# Patient Record
Sex: Female | Born: 1995 | Race: White | Hispanic: No | Marital: Single | Smoking: Never smoker
Health system: Southern US, Community
[De-identification: ages and names within clinical notes are randomized; demographics above are authoritative.]

## PROBLEM LIST (undated history)

## (undated) DIAGNOSIS — N83209 Unspecified ovarian cyst, unspecified side: Secondary | ICD-10-CM

---

## 2015-02-03 ENCOUNTER — Emergency Department (HOSPITAL_COMMUNITY)
Admission: EM | Admit: 2015-02-03 | Discharge: 2015-02-03 | Disposition: A | Discharge status: Home / Self Care | Payer: BLUE CROSS/BLUE SHIELD | Attending: Emergency Medicine | Admitting: Emergency Medicine

## 2015-02-03 ENCOUNTER — Encounter (HOSPITAL_COMMUNITY): Payer: Self-pay | Admitting: Emergency Medicine

## 2015-02-03 DIAGNOSIS — R05 Cough: Secondary | ICD-10-CM | POA: Diagnosis present

## 2015-02-03 DIAGNOSIS — J209 Acute bronchitis, unspecified: Secondary | ICD-10-CM | POA: Insufficient documentation

## 2015-02-03 DIAGNOSIS — Z8742 Personal history of other diseases of the female genital tract: Secondary | ICD-10-CM | POA: Diagnosis not present

## 2015-02-03 DIAGNOSIS — J40 Bronchitis, not specified as acute or chronic: Secondary | ICD-10-CM

## 2015-02-03 HISTORY — DX: Unspecified ovarian cyst, unspecified side: N83.209

## 2015-02-03 MED ORDER — AZITHROMYCIN 250 MG PO TABS: 250.0000 mg | ORAL_TABLET | Freq: Every day | ORAL | Status: DC

## 2015-02-03 MED ORDER — HYDROCODONE-HOMATROPINE 5-1.5 MG/5ML PO SYRP: 5.0000 mL | ORAL_SOLUTION | Freq: Four times a day (QID) | ORAL | Status: DC | PRN

## 2015-02-03 NOTE — ED Notes (Signed)
Pt stable, ambulatory, prescriptions explained, pain 4/10.

## 2015-02-03 NOTE — ED Provider Notes (Signed)
CSN: [id number]     Arrival date & time 02/03/15  1514 History   First MD Initiated Contact with Patient 02/03/15 1527     Chief Complaint  Patient presents with  . Generalized Body Aches  . Cough  . Nasal Congestion      HPI Patient presents with weeklong history of increasing congestion with productive yellow sputum.  Denies fever or chills.  Nausea or vomiting.  Cough is keeping her awake at night.  Patient has no other complaints. Past Medical History  Diagnosis Date  . Ruptured ovarian cyst    History reviewed. No pertinent past surgical history. No family history on file. History  Substance Use Topics  . Smoking status: Never Smoker   . Smokeless tobacco: Not on file  . Alcohol Use: No   OB History    No data available     Review of Systems  All other systems reviewed and are negative  Allergies  Cephalosporins and Toradol  Home Medications   Prior to Admission medications   Medication Sig Start Date End Date Taking? Authorizing Provider  azithromycin (ZITHROMAX) 250 MG tablet Take 1 tablet (250 mg total) by mouth daily. Take first 2 tablets together, then 1 every day until finished. 02/03/15   Nelva Nayobert Demesha Boorman, MD  HYDROcodone-homatropine [facility](HYCODAN) 5-1.5 MG/5ML syrup Take 5 mLs by mouth every 6 (six) hours as needed for cough. 02/03/15   Nelva Nayobert Ronin Rehfeldt, MD   BP 108/66 mmHg  Pulse 81  Temp(Src) 98.3 F (36.8 C) (Oral)  Resp 20  Ht 5\' 5"  (1.651 m)  Wt 130 lb (58.968 kg)  BMI 21.63 kg/m2  SpO2 98%  LMP 01/27/2015 (Exact Date) Physical Exam Physical Exam  Nursing note and vitals reviewed. Constitutional: She is oriented to person, place, and time. She appears well-developed and well-nourished. No distress.  HENT:  Head: Normocephalic and atraumatic.  Eyes: Pupils are equal, round, and reactive to light.  Neck: Normal range of motion.  Cardiovascular: Normal rate and intact distal pulses.   Pulmonary/Chest: No respiratory distress.  No wheezes rhonchi to  auscultation.   Abdominal: Normal appearance. She exhibits no distension.  Musculoskeletal: Normal range of motion.  Neurological: She is alert and oriented to person, place, and time. No cranial nerve deficit.  Skin: Skin is warm and dry. No rash noted.  Psychiatric: She has a normal mood and affect. Her behavior is normal.   ED Course  Procedures (including critical care time) Labs Review Labs Reviewed - No data to display     MDM   Final diagnoses:  Bronchitis        Nelva Nayobert Shametra Cumberland, MD 02/03/15 1539

## 2015-02-03 NOTE — ED Notes (Signed)
Pt c/o body aches with cough and cold symptoms ongoing since last week.

## 2015-02-03 NOTE — Discharge Instructions (Signed)
Upper Respiratory Infection, Adult An upper respiratory infection (URI) is also sometimes known as the common cold. The upper respiratory tract includes the nose, sinuses, throat, trachea, and bronchi. Bronchi are the airways leading to the lungs. Most people improve within 1 week, but symptoms can last up to 2 weeks. A residual cough may last even longer.  CAUSES Many different viruses can infect the tissues lining the upper respiratory tract. The tissues become irritated and inflamed and often become very moist. Mucus production is also common. A cold is contagious. You can easily spread the virus to others by oral contact. This includes kissing, sharing a glass, coughing, or sneezing. Touching your mouth or nose and then touching a surface, which is then touched by another person, can also spread the virus. SYMPTOMS  Symptoms typically develop 1 to 3 days after you come in contact with a cold virus. Symptoms vary from person to person. They may include:  Runny nose.  Sneezing.  Nasal congestion.  Sinus irritation.  Sore throat.  Loss of voice (laryngitis).  Cough.  Fatigue.  Muscle aches.  Loss of appetite.  Headache.  Low-grade fever. DIAGNOSIS  You might diagnose your own cold based on familiar symptoms, since most people get a cold 2 to 3 times a year. Your caregiver can confirm this based on your exam. Most importantly, your caregiver can check that your symptoms are not due to another disease such as strep throat, sinusitis, pneumonia, asthma, or epiglottitis. Blood tests, throat tests, and X-rays are not necessary to diagnose a common cold, but they may sometimes be helpful in excluding other more serious diseases. Your caregiver will decide if any further tests are required. RISKS AND COMPLICATIONS  You may be at risk for a more severe case of the common cold if you smoke cigarettes, have chronic heart disease (such as heart failure) or lung disease (such as asthma), or if  you have a weakened immune system. The very young and very old are also at risk for more serious infections. Bacterial sinusitis, middle ear infections, and bacterial pneumonia can complicate the common cold. The common cold can worsen asthma and chronic obstructive pulmonary disease (COPD). Sometimes, these complications can require emergency medical care and may be life-threatening. PREVENTION  The best way to protect against getting a cold is to practice good hygiene. Avoid oral or hand contact with people with cold symptoms. Wash your hands often if contact occurs. There is no clear evidence that vitamin C, vitamin E, echinacea, or exercise reduces the chance of developing a cold. However, it is always recommended to get plenty of rest and practice good nutrition. TREATMENT  Treatment is directed at relieving symptoms. There is no cure. Antibiotics are not effective, because the infection is caused by a virus, not by bacteria. Treatment may include:  Increased fluid intake. Sports drinks offer valuable electrolytes, sugars, and fluids.  Breathing heated mist or steam (vaporizer or shower).  Eating chicken soup or other clear broths, and maintaining good nutrition.  Getting plenty of rest.  Using gargles or lozenges for comfort.  Controlling fevers with ibuprofen or acetaminophen as directed by your caregiver.  Increasing usage of your inhaler if you have asthma. Zinc gel and zinc lozenges, taken in the first 24 hours of the common cold, can shorten the duration and lessen the severity of symptoms. Pain medicines may help with fever, muscle aches, and throat pain. A variety of non-prescription medicines are available to treat congestion and runny nose. Your caregiver   can make recommendations and may suggest nasal or lung inhalers for other symptoms.  HOME CARE INSTRUCTIONS   Only take over-the-counter or prescription medicines for pain, discomfort, or fever as directed by your  caregiver.  Use a warm mist humidifier or inhale steam from a shower to increase air moisture. This may keep secretions moist and make it easier to breathe.  Drink enough water and fluids to keep your urine clear or pale yellow.  Rest as needed.  Return to work when your temperature has returned to normal or as your caregiver advises. You may need to stay home longer to avoid infecting others. You can also use a face mask and careful hand washing to prevent spread of the virus. SEEK MEDICAL CARE IF:   After the first few days, you feel you are getting worse rather than better.  You need your caregiver's advice about medicines to control symptoms.  You develop chills, worsening shortness of breath, or brown or red sputum. These may be signs of pneumonia.  You develop yellow or brown nasal discharge or pain in the face, especially when you bend forward. These may be signs of sinusitis.  You develop a fever, swollen neck glands, pain with swallowing, or white areas in the back of your throat. These may be signs of strep throat. SEEK IMMEDIATE MEDICAL CARE IF:   You have a fever.  You develop severe or persistent headache, ear pain, sinus pain, or chest pain.  You develop wheezing, a prolonged cough, cough up blood, or have a change in your usual mucus (if you have chronic lung disease).  You develop sore muscles or a stiff neck. Document Released: 04/14/2001 Document Revised: 01/11/2012 Document Reviewed: 01/24/2014 Exit[employer] Patient Information 2015 [employer]. This information is not intended to replace advice given to you by your health care provider. Make sure you discuss any questions you have with your health care provider.  

## 2015-02-19 ENCOUNTER — Encounter (HOSPITAL_COMMUNITY): Payer: Self-pay | Admitting: *Deleted

## 2015-02-19 ENCOUNTER — Emergency Department (HOSPITAL_COMMUNITY)
Admission: EM | Admit: 2015-02-19 | Discharge: 2015-02-19 | Disposition: A | Discharge status: Home / Self Care | Payer: BLUE CROSS/BLUE SHIELD | Attending: Emergency Medicine | Admitting: Emergency Medicine

## 2015-02-19 DIAGNOSIS — H109 Unspecified conjunctivitis: Secondary | ICD-10-CM | POA: Insufficient documentation

## 2015-02-19 DIAGNOSIS — Z792 Long term (current) use of antibiotics: Secondary | ICD-10-CM | POA: Insufficient documentation

## 2015-02-19 DIAGNOSIS — H578 Other specified disorders of eye and adnexa: Secondary | ICD-10-CM | POA: Diagnosis present

## 2015-02-19 DIAGNOSIS — Z87448 Personal history of other diseases of urinary system: Secondary | ICD-10-CM | POA: Insufficient documentation

## 2015-02-19 MED ORDER — POLYMYXIN B-TRIMETHOPRIM 10000-0.1 UNIT/ML-% OP SOLN: 1.0000 [drp] | OPHTHALMIC | Status: DC

## 2015-02-19 MED ORDER — TETRACAINE HCL 0.5 % OP SOLN
1.0000 [drp] | Freq: Once | OPHTHALMIC | Status: AC
  Administered 2015-02-19: 1 [drp] via OPHTHALMIC
  Filled 2015-02-19: qty 2

## 2015-02-19 MED ORDER — FLUORESCEIN SODIUM 1 MG OP STRP
1.0000 | ORAL_STRIP | Freq: Once | OPHTHALMIC | Status: AC
  Administered 2015-02-19: 1 via OPHTHALMIC
  Filled 2015-02-19: qty 1

## 2015-02-19 NOTE — ED Provider Notes (Signed)
CSN: [id number]     Arrival date & time 02/19/15  1328 History  This chart was scribed for non-physician practitioner Roxy Horseman, PA-C working with Richardean Canal, MD by Littie Deeds, ED Scribe. This patient was seen in room TR04C/TR04C and the patient's care was started at 2:39 PM.     Chief Complaint  Patient presents with  . Conjunctivitis   The history is provided by the patient. No language interpreter was used.    HPI Comments: Nichole Mendez is a 19 y.o. female who presents to the Emergency Department complaining of gradual onset, constant eye redness that started when she woke up this morning. She also reports having associated eye itching and watery eye discharge. Patient denies any known foreign objects in her eye. She does report having sick contacts throughout campus. She denies smoking.   Past Medical History  Diagnosis Date  . Ruptured ovarian cyst    History reviewed. No pertinent past surgical history. History reviewed. No pertinent family history. History  Substance Use Topics  . Smoking status: Never Smoker   . Smokeless tobacco: Not on file  . Alcohol Use: No   OB History    No data available     Review of Systems  Constitutional: Negative for fever.  Eyes: Positive for discharge, redness and itching.      Allergies  Cephalosporins and Toradol  Home Medications   Prior to Admission medications   Medication Sig Start Date End Date Taking? Authorizing Provider  azithromycin (ZITHROMAX) 250 MG tablet Take 1 tablet (250 mg total) by mouth daily. Take first 2 tablets together, then 1 every day until finished. 02/03/15   Nelva Nay, MD  HYDROcodone-homatropine [facility]) 5-1.5 MG/5ML syrup Take 5 mLs by mouth every 6 (six) hours as needed for cough. 02/03/15   Nelva Nay, MD   BP 97/67 mmHg  Pulse 95  Temp(Src) 98.9 F (37.2 C) (Oral)  Resp 18  Ht  (1.651 m)  Wt 130 lb (58.968 kg)  BMI 21.63 kg/m2  SpO2 100%  LMP 01/27/2015 (Exact  Date) Physical Exam  Constitutional: She is oriented to person, place, and time. She appears well-developed and well-nourished. No distress.  HENT:  Head: Normocephalic and atraumatic.  Mouth/Throat: Oropharynx is clear and moist. No oropharyngeal exudate.  Eyes: Pupils are equal, round, and reactive to light.  Visual acuity: right 20/100, left 20/100, bilateral 20/100  Mild conjunctival erythema bilaterally, more on the right than on the left, no corneal abrasion, no foreign body, normal extraocular eye movement  Neck: Neck supple.  Cardiovascular: Normal rate.   Pulmonary/Chest: Effort normal.  Musculoskeletal: She exhibits no edema.  Neurological: She is alert and oriented to person, place, and time. No cranial nerve deficit.  Skin: Skin is warm and dry. No rash noted.  Psychiatric: She has a normal mood and affect. Her behavior is normal.  Nursing note and vitals reviewed.   ED Course  Procedures  DIAGNOSTIC STUDIES: Oxygen Saturation is 100% on room air, normal by my interpretation.    COORDINATION OF CARE: 2:42 PM-Discussed treatment plan which includes prescription eye drops with pt at bedside and pt agreed to plan. Instructed to wash bed sheets/towels and avoid touching eyes.   Labs Review Labs Reviewed - No data to display  Imaging Review No results found.   EKG Interpretation None      MDM   Final diagnoses:  Bilateral conjunctivitis    Patient with bilateral conjunctivitis, she does have some thick milky  discharge and moderate conjunctival erythema, no corneal laceration or abrasion. Will treat with Polytrim. Recommend ophthalmology follow-up. Patient understands agrees to plan. She is stable and ready for discharge.  I personally performed the services described in this documentation, which was scribed in my presence. The recorded information has been reviewed and is accurate.    Roxy HorsemanRobert Benetta Maclaren, PA-C 02/19/15 1453  Richardean Canalavid H Yao, MD 02/19/15 [phone]

## 2015-02-19 NOTE — Discharge Instructions (Signed)

## 2015-02-19 NOTE — ED Notes (Signed)
Pt reports bilateral eye reddness, drainage, itching. Pt also reports cold symptoms starting today.

## 2015-09-02 ENCOUNTER — Encounter (HOSPITAL_COMMUNITY): Payer: Self-pay | Admitting: Emergency Medicine

## 2015-09-02 ENCOUNTER — Emergency Department (HOSPITAL_COMMUNITY)
Admission: EM | Admit: 2015-09-02 | Discharge: 2015-09-02 | Disposition: A | Discharge status: Home / Self Care | Payer: BLUE CROSS/BLUE SHIELD | Attending: Emergency Medicine | Admitting: Emergency Medicine

## 2015-09-02 ENCOUNTER — Emergency Department (INDEPENDENT_AMBULATORY_CARE_PROVIDER_SITE_OTHER)
Admission: EM | Admit: 2015-09-02 | Discharge: 2015-09-02 | Disposition: A | Discharge status: Home / Self Care | Payer: BLUE CROSS/BLUE SHIELD | Source: Home / Self Care | Attending: Emergency Medicine | Admitting: Emergency Medicine

## 2015-09-02 DIAGNOSIS — Z8742 Personal history of other diseases of the female genital tract: Secondary | ICD-10-CM | POA: Diagnosis not present

## 2015-09-02 DIAGNOSIS — G43109 Migraine with aura, not intractable, without status migrainosus: Secondary | ICD-10-CM

## 2015-09-02 DIAGNOSIS — H9203 Otalgia, bilateral: Secondary | ICD-10-CM | POA: Diagnosis not present

## 2015-09-02 DIAGNOSIS — R202 Paresthesia of skin: Secondary | ICD-10-CM | POA: Diagnosis not present

## 2015-09-02 DIAGNOSIS — H5713 Ocular pain, bilateral: Secondary | ICD-10-CM | POA: Insufficient documentation

## 2015-09-02 DIAGNOSIS — B9789 Other viral agents as the cause of diseases classified elsewhere: Secondary | ICD-10-CM

## 2015-09-02 DIAGNOSIS — J069 Acute upper respiratory infection, unspecified: Secondary | ICD-10-CM | POA: Insufficient documentation

## 2015-09-02 DIAGNOSIS — R8299 Other abnormal findings in urine: Secondary | ICD-10-CM | POA: Insufficient documentation

## 2015-09-02 DIAGNOSIS — J029 Acute pharyngitis, unspecified: Secondary | ICD-10-CM | POA: Diagnosis present

## 2015-09-02 DIAGNOSIS — Z3202 Encounter for pregnancy test, result negative: Secondary | ICD-10-CM | POA: Insufficient documentation

## 2015-09-02 DIAGNOSIS — R8290 Unspecified abnormal findings in urine: Secondary | ICD-10-CM

## 2015-09-02 LAB — URINE MICROSCOPIC-ADD ON

## 2015-09-02 LAB — URINALYSIS, ROUTINE W REFLEX MICROSCOPIC
Bilirubin Urine: NEGATIVE
Glucose, UA: NEGATIVE mg/dL
KETONES UR: NEGATIVE mg/dL
Nitrite: NEGATIVE
PROTEIN: 100 mg/dL — AB
Specific Gravity, Urine: 1.02 (ref 1.005–1.030)
Urobilinogen, UA: 1 mg/dL (ref 0.0–1.0)
pH: 7 (ref 5.0–8.0)

## 2015-09-02 LAB — PREGNANCY, URINE: Preg Test, Ur: NEGATIVE

## 2015-09-02 MED ORDER — SUMATRIPTAN SUCCINATE 100 MG PO TABS: 100.0000 mg | ORAL_TABLET | ORAL | Status: DC | PRN

## 2015-09-02 MED ORDER — FLUTICASONE PROPIONATE 50 MCG/ACT NA SUSP: 2.0000 | Freq: Every day | NASAL | Status: DC

## 2015-09-02 MED ORDER — ONDANSETRON 4 MG PO TBDP: 4.0000 mg | ORAL_TABLET | Freq: Three times a day (TID) | ORAL | Status: DC | PRN

## 2015-09-02 MED ORDER — BUTALBITAL-APAP-CAFFEINE 50-325-40 MG PO TABS: 1.0000 | ORAL_TABLET | Freq: Four times a day (QID) | ORAL | Status: DC | PRN

## 2015-09-02 MED ORDER — NAPROXEN 500 MG PO TABS: 500.0000 mg | ORAL_TABLET | Freq: Two times a day (BID) | ORAL | Status: DC

## 2015-09-02 MED ORDER — BENZONATATE 100 MG PO CAPS: 100.0000 mg | ORAL_CAPSULE | Freq: Three times a day (TID) | ORAL | Status: DC

## 2015-09-02 NOTE — ED Provider Notes (Signed)
HPI  SUBJECTIVE:  Nichole Mendez is a 19 y.o. female who presents with right-sided tingling and numbness on her right arm and right face starting at 1930 tonight. Stated lasted approximately 20 minutes and then resolved. She now reports a central, dull, throbbing headache located in the middle for for head with some photophobia. Symptoms are worse with light, no alleviating factors. She has not tried anything for this. She states that she has had identical symptoms before on the side when she has had migraines. States that the numbness has now resolved. She denies arm or leg weakness, dysarthria, aphasia, nausea, vomiting, chest pain, shortness of breath, facial droop, sinus pain/pressure, fevers, presyncope, syncope. Reports some mild nasal congestion. Past medical history of migraines with aura, ruptured ovarian cysts. Negative for stroke, atrial fibrillation or other arrhythmia, sickle cell disease. Family history negative for strokes. Patient states that Naprosyn and Zofran works well for her migraines.    Patient was seen in the ER this morning for sore throat and urinary complaints. She was thought to have a viral URI, and possible UTI, however, patient decided to wait for urine culture results prior to initiating treatment.   Past Medical History  Diagnosis Date  . Ruptured ovarian cyst     History reviewed. No pertinent past surgical history.  No family history on file.  Social History  Substance Use Topics  . Smoking status: Never Smoker   . Smokeless tobacco: None  . Alcohol Use: No    No current facility-administered medications for this encounter.  Current outpatient prescriptions:  .  fluticasone (FLONASE) 50 MCG/ACT nasal spray, Place 2 sprays into both nostrils daily., Disp: 9.9 g, Rfl: 2 .  naproxen (NAPROSYN) 500 MG tablet, Take 1 tablet (500 mg total) by mouth 2 (two) times daily., Disp: 20 tablet, Rfl: 0 .  ondansetron (ZOFRAN ODT) 4 MG disintegrating  tablet, Take 1 tablet (4 mg total) by mouth every 8 (eight) hours as needed for nausea or vomiting., Disp: 20 tablet, Rfl: 0 .  SUMAtriptan (IMITREX) 100 MG tablet, Take 1 tablet (100 mg total) by mouth every 2 (two) hours as needed for migraine. One tab at onset and may repeat x1 in 1 hour. Max 200 mg/24 hours, Disp: 10 tablet, Rfl: 0  Allergies  Allergen Reactions  . Cephalosporins   . Toradol [Ketorolac Tromethamine]      ROS  As noted in HPI.   Physical Exam  LMP 08/12/2015  Constitutional: Well developed, well nourished, no acute distress. + photophobia Eyes: PERRL, EOMI, conjunctiva normal bilaterally . Patient unable to tolerate funduscopic. HENT: Normocephalic, atraumatic,mucus membranes moist. bilateral cerumen impaction. Minimal nasal congestion. Mild frontal sinus tenderness. No maxillary sinus tenderness. Dentition normal. Neck, no meningismus. Respiratory: Clear to auscultation bilaterally, no rales, no wheezing, no rhonchi Cardiovascular: Normal rate and rhythm, no murmurs, no gallops, no rubs GI: Soft, nondistended, normal bowel sounds, nontender, no rebound, no guarding Back: no CVAT skin: No rash, skin intact Musculoskeletal: No edema, no tenderness, no deformities Neurologic: Alert & oriented x 3, CN II-XII intact, sensation to temperature, light touch, pinprick equal on her upper extremities and face, no motor deficits, tandem gait steady, Romberg negative, finger nose, heel shin within normal limits. Psychiatric: Speech and behavior appropriate   ED Course   Medications - No data to display  No orders of the defined types were placed in this encounter.   Results for orders placed or performed during the hospital encounter of 09/02/15 (from the past 24  hour(s))  Urinalysis, Routine w reflex microscopic ([facility])     Status: Abnormal   Collection Time: 09/02/15 10:39 AM  Result Value Ref Range   Color, Urine YELLOW YELLOW   APPearance CLOUDY (A) CLEAR    Specific Gravity, Urine 1.020 1.005 - 1.030   pH 7.0 5.0 - 8.0   Glucose, UA NEGATIVE NEGATIVE mg/dL   Hgb urine dipstick MODERATE (A) NEGATIVE   Bilirubin Urine NEGATIVE NEGATIVE   Ketones, ur NEGATIVE NEGATIVE mg/dL   Protein, ur 409100 (A) NEGATIVE mg/dL   Urobilinogen, UA 1.0 0.0 - 1.0 mg/dL   Nitrite NEGATIVE NEGATIVE   Leukocytes, UA SMALL (A) NEGATIVE  Pregnancy, urine     Status: None   Collection Time: 09/02/15 10:39 AM  Result Value Ref Range   Preg Test, Ur NEGATIVE NEGATIVE  Urine microscopic-add on     Status: Abnormal   Collection Time: 09/02/15 10:39 AM  Result Value Ref Range   Squamous Epithelial / LPF FEW (A) RARE   WBC, UA 7-10 <3 WBC/hpf   RBC / HPF 11-20 <3 RBC/hpf   Bacteria, UA RARE RARE   Urine-Other MUCOUS PRESENT    No results found.  ED Clinical Impression  Paresthesias  Migraine with aura and without status migrainosus, not intractable  ED Assessment/Plan   Urine pregnant negative.  Patient is completely neurologically intact, states her symptoms have resolved and now has a headache. Given that she has had identical symptoms before on this side with migraines in the past, we'll treat as such. Patient states Naprosyn and Zofran work well for her. She has also been on Imitrex in the past. She does not have any at home.  No evidence of stroke here, but gave pt very strict return precautions. .  Discussed  MDM, plan and followup with patient. Discussed sn/sx that should prompt return to the  ED. Patient  agrees with plan.   *This clinic note was created using Dragon dictation software. Therefore, there may be occasional mistakes despite careful proofreading.  ?   Domenick GongAshley Zailyn Rowser, MD 09/02/15 2033

## 2015-09-02 NOTE — ED Provider Notes (Signed)
CSN: [id number]     Arrival date & time 09/02/15  7829 History   By signing my name below, I, Freida Busman, attest that this documentation has been prepared under the direction and in the presence of non-physician practitioner, Dierdre Forth, PA-C. Electronically Signed: Freida Busman, Scribe. 09/02/2015. 10:57 AM.    Chief Complaint  Patient presents with  . Sore Throat  . Otalgia  . Cough  . Urinary Tract Infection   The history is provided by the patient. No language interpreter was used.   HPI Comments:  Nichole Mendez is a 19 y.o. female who presents to the Emergency Department complaining of a sore throat since yesterday. She reports associated bilateral ear pain, eye pain with movement of her eyes, nasal congestion and pressure, cough, and mild and throbbing HA for one week.  She has taken robotussin without relief. Pt denies sick contacts, fever. No aggravating or alleviating factors noted.  Patient also complains of malodorous urine this morning. She denies dysuria, hematuria, fever, chills, abdominal pain, nausea, vomiting.  Patient reports she is sexually active in taking oral contraceptives.   Past Medical History  Diagnosis Date  . Ruptured ovarian cyst    History reviewed. No pertinent past surgical history. No family history on file. Social History  Substance Use Topics  . Smoking status: Never Smoker   . Smokeless tobacco: None  . Alcohol Use: No   OB History    No data available     Review of Systems  Constitutional: Negative for fever, chills, appetite change and fatigue.  HENT: Positive for congestion, ear pain, postnasal drip, rhinorrhea, sinus pressure and sore throat. Negative for ear discharge and mouth sores.   Eyes: Negative for pain and visual disturbance.  Respiratory: Positive for cough. Negative for chest tightness, shortness of breath, wheezing and stridor.   Cardiovascular: Negative for chest pain, palpitations and leg swelling.   Gastrointestinal: Negative for nausea, vomiting, abdominal pain and diarrhea.  Genitourinary: Negative for dysuria, urgency, frequency, hematuria, vaginal bleeding and vaginal discharge.       Malodorous urine  Musculoskeletal: Negative for myalgias, back pain, arthralgias and neck stiffness.  Skin: Negative for rash.  Neurological: Negative for syncope, light-headedness, numbness and headaches.  Hematological: Negative for adenopathy.  Psychiatric/Behavioral: The patient is not nervous/anxious.   All other systems reviewed and are negative.  Allergies  Cephalosporins and Toradol  Home Medications   Prior to Admission medications   Medication Sig Start Date End Date Taking? Authorizing Provider  azithromycin (ZITHROMAX) 250 MG tablet Take 1 tablet (250 mg total) by mouth daily. Take first 2 tablets together, then 1 every day until finished. 02/03/15   Nelva Nay, MD  benzonatate (TESSALON) 100 MG capsule Take 1 capsule (100 mg total) by mouth every 8 (eight) hours. 09/02/15   Fordyce Lepak, PA-C  fluticasone (FLONASE) 50 MCG/ACT nasal spray Place 2 sprays into both nostrils daily. 09/02/15   Vernal Rutan, PA-C  HYDROcodone-homatropine (HYCODAN) 5-1.5 MG/5ML syrup Take 5 mLs by mouth every 6 (six) hours as needed for cough. 02/03/15   Nelva Nay, MD  trimethoprim-polymyxin b (POLYTRIM) ophthalmic solution Place 1 drop into both eyes every 4 (four) hours. 02/19/15   Roxy Horseman, PA-C   BP 104/59 mmHg  Pulse 88  Temp(Src) 98.4 F (36.9 C) (Oral)  Resp 16  Ht  (1.651 m)  Wt 135 lb (61.236 kg)  BMI 22.47 kg/m2  SpO2 100%  LMP 08/12/2015 Physical Exam  Constitutional: She is oriented to  person, place, and time. She appears well-developed and well-nourished. No distress.  Awake, alert, nontoxic appearance  HENT:  Head: Normocephalic and atraumatic.  Right Ear: Tympanic membrane, external ear and ear canal normal.  Left Ear: Tympanic membrane, external ear and  ear canal normal.  Nose: Mucosal edema and rhinorrhea present. No epistaxis. Right sinus exhibits no maxillary sinus tenderness and no frontal sinus tenderness. Left sinus exhibits no maxillary sinus tenderness and no frontal sinus tenderness.  Mouth/Throat: Uvula is midline, oropharynx is clear and moist and mucous membranes are normal. Mucous membranes are not pale and not cyanotic. No oropharyngeal exudate, posterior oropharyngeal edema, posterior oropharyngeal erythema or tonsillar abscesses.  Eyes: Conjunctivae are normal. Pupils are equal, round, and reactive to light. No scleral icterus.  Neck: Normal range of motion and full passive range of motion without pain. Neck supple.  Cardiovascular: Normal rate, regular rhythm, normal heart sounds and intact distal pulses.   Pulmonary/Chest: Effort normal and breath sounds normal. No stridor. No respiratory distress. She has no wheezes.  Clear and equal breath sounds without focal wheezes, rhonchi, rales  Abdominal: Soft. Bowel sounds are normal. She exhibits no mass. There is no tenderness. There is no rebound and no guarding.  Abdomen is soft and nontender  Musculoskeletal: Normal range of motion. She exhibits no edema.  Lymphadenopathy:    She has no cervical adenopathy.  Neurological: She is alert and oriented to person, place, and time.  Speech is clear and goal oriented Moves extremities without ataxia  Skin: Skin is warm and dry. No rash noted. She is not diaphoretic.  Psychiatric: She has a normal mood and affect.  Nursing note and vitals reviewed.   ED Course  Procedures   DIAGNOSTIC STUDIES:  Oxygen Saturation is 100% on RA, normal by my interpretation.    COORDINATION OF CARE:  10:34 AM Will order strep and UA. Discussed treatment plan with pt at bedside and pt agreed to plan.  11:45 AM Pt updated with results.    Labs Review Labs Reviewed  URINALYSIS, ROUTINE W REFLEX MICROSCOPIC ([facility]) - Abnormal; Notable for  the following:    APPearance CLOUDY (*)    Hgb urine dipstick MODERATE (*)    Protein, ur 100 (*)    Leukocytes, UA SMALL (*)    All other components within normal limits  URINE MICROSCOPIC-ADD ON - Abnormal; Notable for the following:    Squamous Epithelial / LPF FEW (*)    All other components within normal limits  URINE CULTURE  PREGNANCY, URINE    Imaging Review No results found. I have personally reviewed and evaluated these lab results as part of my medical decision-making.   EKG Interpretation None      MDM   Final diagnoses:  Viral URI with cough  Malodorous urine   Nichole Mendez presents with multiple complaints.  Pt afebrile with clear and equal breath sounds. No indication for chest x-ray at this time. Patients symptoms are consistent with URI, likely viral etiology. Discussed that antibiotics are not indicated for viral infections. Pt will be discharged with symptomatic treatment.    Patient also with malodorous urine but no other symptoms of UTI. Will check UA and pregnancy test.  12:05 PM UA with small leukocytes, negative nitrites and only 7-10 white blood cells. Patient admits to a weekend of heavy alcohol usage and no water intake. Question potential dehydration. Patient remains without dysuria or visible hematuria. Moderate amount of hemoglobin noted.  Discussed pros and  cons of treating with antibiotic empirically versus waiting for culture results. Patient wishes to wait for culture results. Recommend increased water intake for the next several days and to return to emergency department if new symptoms including fever, lower bowel pain, dysuria or frank hematuria developed.  Verbalizes understanding and is agreeable with plan. Pt is hemodynamically stable & in NAD prior to dc.  BP 104/59 mmHg  Pulse 88  Temp(Src) 98.4 F (36.9 C) (Oral)  Resp 16  Ht  (1.651 m)  Wt 135 lb (61.236 kg)  BMI 22.47 kg/m2  SpO2 100%  LMP 08/12/2015  I  personally performed the services described in this documentation, which was scribed in my presence. The recorded information has been reviewed and is accurate.     Dahlia Client Johne Buckle, PA-C 09/02/15 1205  Eber Hong, MD 09/04/15 [phone]

## 2015-09-02 NOTE — Discharge Instructions (Signed)
Go to the ER if the numbness and tingling returns, if you have trouble talking, trouble walking, arm or leg weakness, problems getting your words out, problems putting sentences together, facial droop, visual changes, if your headache is not treated with these medications, or for other concerns

## 2015-09-02 NOTE — ED Notes (Signed)
Pt here with c/o progressive numbness that started in right hand today, now radiating up arm Denies chest pain or sob States she experienced same sx's with migraine

## 2015-09-02 NOTE — ED Notes (Signed)
Patient states sore throat, L ear pain, and dry cough x 2 days.   Patient denies fever, N/V.

## 2015-09-02 NOTE — Discharge Instructions (Signed)
1. Medications: flonase, mucinex, tessalon, usual home medications 2. Treatment: rest, drink plenty of fluids, take tylenol or ibuprofen for fever control 3. Follow Up: Please followup with your primary doctor in 3 days for discussion of your diagnoses and further evaluation after today's visit; if you do not have a primary care doctor use the resource guide provided to find one; Return to the ER for high fevers, difficulty breathing or other concerning symptoms  You have a urine culture pending. If this grows bacteria we will contact you with an antibiotic prescription. Please return to the emergency department if you develop new symptoms including abdominal pain, low-grade fever, burning with urination or other concerns.    Upper Respiratory Infection, Adult Most upper respiratory infections (URIs) are a viral infection of the air passages leading to the lungs. A URI affects the nose, throat, and upper air passages. The most common type of URI is nasopharyngitis and is typically referred to as "the common cold." URIs run their course and usually go away on their own. Most of the time, a URI does not require medical attention, but sometimes a bacterial infection in the upper airways can follow a viral infection. This is called a secondary infection. Sinus and middle ear infections are common types of secondary upper respiratory infections. Bacterial pneumonia can also complicate a URI. A URI can worsen asthma and chronic obstructive pulmonary disease (COPD). Sometimes, these complications can require emergency medical care and may be life threatening.  CAUSES Almost all URIs are caused by viruses. A virus is a type of germ and can spread from one person to another.  RISKS FACTORS You may be at risk for a URI if:   You smoke.   You have chronic heart or lung disease.  You have a weakened defense (immune) system.   You are very young or very old.   You have nasal allergies or  asthma.  You work in crowded or poorly ventilated areas.  You work in health care facilities or schools. SIGNS AND SYMPTOMS  Symptoms typically develop 2-3 days after you come in contact with a cold virus. Most viral URIs last 7-10 days. However, viral URIs from the influenza virus (flu virus) can last 14-18 days and are typically more severe. Symptoms may include:   Runny or stuffy (congested) nose.   Sneezing.   Cough.   Sore throat.   Headache.   Fatigue.   Fever.   Loss of appetite.   Pain in your forehead, behind your eyes, and over your cheekbones (sinus pain).  Muscle aches.  DIAGNOSIS  Your health care provider may diagnose a URI by:  Physical exam.  Tests to check that your symptoms are not due to another condition such as:  Strep throat.  Sinusitis.  Pneumonia.  Asthma. TREATMENT  A URI goes away on its own with time. It cannot be cured with medicines, but medicines may be prescribed or recommended to relieve symptoms. Medicines may help:  Reduce your fever.  Reduce your cough.  Relieve nasal congestion. HOME CARE INSTRUCTIONS   Take medicines only as directed by your health care provider.   Gargle warm saltwater or take cough drops to comfort your throat as directed by your health care provider.  Use a warm mist humidifier or inhale steam from a shower to increase air moisture. This may make it easier to breathe.  Drink enough fluid to keep your urine clear or pale yellow.   Eat soups and other clear broths and  maintain good nutrition.   Rest as needed.   Return to work when your temperature has returned to normal or as your health care provider advises. You may need to stay home longer to avoid infecting others. You can also use a face mask and careful hand washing to prevent spread of the virus.  Increase the usage of your inhaler if you have asthma.   Do not use any tobacco products, including cigarettes, chewing tobacco,  or electronic cigarettes. If you need help quitting, ask your health care provider. PREVENTION  The best way to protect yourself from getting a cold is to practice good hygiene.   Avoid oral or hand contact with people with cold symptoms.   Wash your hands often if contact occurs.  There is no clear evidence that vitamin C, vitamin E, echinacea, or exercise reduces the chance of developing a cold. However, it is always recommended to get plenty of rest, exercise, and practice good nutrition.  SEEK MEDICAL CARE IF:   You are getting worse rather than better.   Your symptoms are not controlled by medicine.   You have chills.  You have worsening shortness of breath.  You have brown or red mucus.  You have yellow or brown nasal discharge.  You have pain in your face, especially when you bend forward.  You have a fever.  You have swollen neck glands.  You have pain while swallowing.  You have white areas in the back of your throat. SEEK IMMEDIATE MEDICAL CARE IF:   You have severe or persistent:  Headache.  Ear pain.  Sinus pain.  Chest pain.  You have chronic lung disease and any of the following:  Wheezing.  Prolonged cough.  Coughing up blood.  A change in your usual mucus.  You have a stiff neck.  You have changes in your:  Vision.  Hearing.  Thinking.  Mood. MAKE SURE YOU:   Understand these instructions.  Will watch your condition.  Will get help right away if you are not doing well or get worse.   This information is not intended to replace advice given to you by your health care provider. Make sure you discuss any questions you have with your health care provider.   Document Released: 04/14/2001 Document Revised: 03/05/2015 Document Reviewed: 01/24/2014 Elsevier Interactive Patient Education 2016 [employer].    Emergency Department Resource Guide 1) Find a Doctor and Pay Out of Pocket Although you won't have to find out who is  covered by your insurance plan, it is a good idea to ask around and get recommendations. You will then need to call the office and see if the doctor you have chosen will accept you as a new patient and what types of options they offer for patients who are self-pay. Some doctors offer discounts or will set up payment plans for their patients who do not have insurance, but you will need to ask so you aren't surprised when you get to your appointment.  2) Contact Your Local Health Department Not all health departments have doctors that can see patients for sick visits, but many do, so it is worth a call to see if yours does. If you don't know where your local health department is, you can check in your phone book. The CDC also has a tool to help you locate your state's health department, and many state websites also have listings of all of their local health departments.  3) Find a Walk-in Clinic If  your illness is not likely to be very severe or complicated, you may want to try a walk in clinic. These are popping up all over the country in pharmacies, drugstores, and shopping centers. They're usually staffed by nurse practitioners or physician assistants that have been trained to treat common illnesses and complaints. They're usually fairly quick and inexpensive. However, if you have serious medical issues or chronic medical problems, these are probably not your best option.  No Primary Care Doctor: - Call Health Connect at  [phone] - they can help you locate a primary care doctor that  accepts your insurance, provides certain services, etc. - Physician Referral Service- [phone]  Chronic Pain Problems: Organization         Address  Phone   Notes  [facility]  [phone] Patients need to be referred by their primary care doctor.   Medication Assistance: Organization         Address  Phone   Notes  [facility] [place] [facility] [address] [place], [place] [address] [phone] --Must be a resident of [facility] -- Must have NO insurance coverage whatsoever (no Medicaid/ Medicare, etc.) -- The pt. MUST have a primary care doctor that directs their care regularly and follows them in the community   [facility]  [phone]   [employer]  [phone]    Agencies that provide inexpensive medical care: Organization         Address  Phone   Notes  [place]  [phone]   [facility]    [phone]   [facility] [place], [place] [address] [phone]   [place] [address], [place] [phone]   Planned Parenthood    [phone]   [facility]    [phone]   [place] and [address], [place] Phone:  [phone], Fax:  [phone] Hours of Operation:  9 am - 6 pm, M-F.  Also accepts Medicaid/Medicare and self-pay.  [facility] for [address] [place], [address], [place] Phone: [phone], Fax: [phone]. Hours of Operation:  8:30 am - 5:30 pm, M-F.  Also accepts Medicaid and self-pay.  [facility] [place] [address] [place] Phone: [phone]   [place], [place], [place] [phone], Ext. [phone] Mondays & Thursdays: 7-9 AM.  First 15 patients are seen on a first come, first serve basis.    [place] Providers:  Organization         Address  Phone   Notes  [facility] [address], [place] [phone] Also accepts self-pay patients.  [facility] [address], [place]  [phone]   [place], [address], [place] [phone]   [facility] [address], [place] [phone]   Lucianne Lei [address], [place]   [phone] Only accepts [place] patients after they have their name applied to their card.   Self-Pay (no insurance) in [facility]:  Organization         Address  Phone   Notes  Sickle Cell Patients, [facility] [place], [place] [phone]  [facility] Urgent Care [address] [place], [place] [phone]   [facility] [place]  [address], [place] [phone]   [facility]/Dr. Osei-Bonsu  [address] [place] or [address], [place] [phone] Phone number for both [place] and [place] locations is the same.  Urgent Medical and [facility] [address], [place] [phone]   [facility] [address] [place] or [address] [phone]   [facility] [address] [place] [phone], phone; [phone], fax Sees patients 1st and 3rd Saturday of every month.  Must not qualify for public or private insurance (i.e. Medicaid, Medicare, [place] Health Choice, Veterans' Benefits)  Household income should be no more than 200% of the poverty level The clinic cannot treat you if you are pregnant or think you are pregnant  Sexually transmitted diseases are not treated at the clinic.    Dental Care: Organization         Address  Phone  Notes  [facility] Department of [facility] [address] [place], [place] [phone] Accepts children up to age 68 who are enrolled in [place] or [place] Health Choice; pregnant women with a Medicaid card; and children who have applied for Medicaid or [place] Health Choice, but were declined, whose parents can pay a reduced fee at time of service.  [facility]  [address], [place] [phone] Accepts children up to age 52 who are enrolled  in [place] or [place] Health Choice; pregnant women with a Medicaid card; and children who have applied for Medicaid or  Health Choice, but were declined, whose parents can pay a reduced fee at time of service.  [facility]  [address] [place], [place] [phone] Patients are seen by appointment only. Walk-ins are not accepted. [facility] will see patients 79 years of age and older. Monday - Tuesday (8am-5pm) Most Wednesdays (8:30-5pm) $30 per visit, cash only  [facility] Adult Dental Access PROGRAM  [address], [facility] [phone] Patients are seen by appointment only. Walk-ins are not accepted. [facility] will see patients 47 years of age and older. One Wednesday Evening (Monthly: Volunteer Based).  $30 per visit, cash only  [employer]  [phone] for adults; Children under age 68, call Graduate Pediatric Dentistry at [phone]. Children aged 73-14, please call [phone] to request a pediatric application.  Dental services are provided in all areas of dental care including fillings, crowns and bridges, complete and partial dentures, implants, gum treatment, root canals, and extractions. Preventive care is also provided. Treatment is provided to both adults and children. Patients are selected via a lottery and there is often a waiting list.   [facility] [address], [place]  [phone] [email or web address]   [facility] [address] [place], [place] [phone], Ext. [phone] Second and Fourth Thursday of each month, opens at 6:30 AM; Clinic ends at 9 AM.  Patients are seen on a first-come first-served basis, and a limited number are seen during each clinic.   [facility]  [address] [place], [place] [phone]   Eligibility Requirements You must have lived in [place], [place], or [place] counties for at least the last  three months.   You cannot be  eligible for state or federal sponsored [place], including [employer], [place], or [employer].   You generally cannot be eligible for healthcare insurance through your employer.    How to apply: Eligibility screenings are held every Tuesday and Wednesday afternoon from 1:00 pm until 4:00 pm. You do not need an appointment for the interview!  [facility] [address] [place], [place] [phone]   [facility] Health Department  [phone]   [facility] Health Department  [phone]   [facility] Health Department  [phone]    Behavioral Health Resources in the Community: Intensive Outpatient Programs Organization         Address  Phone  Notes  [facility] Services [address], [place], [place] [phone]   [facility] Outpatient [address], [place], [place] [phone]   [facility] [address], [place], [place]  [phone]   [facility] [address]  [place], [place] [phone] or [phone]   Substance Abuse Resources Organization         Address  Phone  Notes  [facility] Drug Services  [phone]   [facility]  [phone]   [facility] [place]  [phone]   Floydene Flock  [phone]   Residential & Outpatient Substance Abuse Program  [phone]   Psychological Services Organization         Address  Phone  Notes  [facility]  [phone]   [facility] Services  [phone]   [facility] [address], [place] [phone] or [phone]    Mobile Crisis Teams Organization         Address  Phone  Notes  [facility], Mobile Crisis Care Unit  [phone]   [facility]  [address] [place], [place] [phone]   Doristine Locks [address] [place] [phone]    Self-Help/Support Groups Organization          Address  Phone             Notes  [facility] [place] - variety of support groups  [phone]- [phone] Call for more information  Narcotics Anonymous (NA), [facility] [address], [employer] [place]  2 meetings at this location   Statistician         Address  Phone  Notes  [facility] [address],    [place]  [phone]   [facility]  [address], [place] [address], [place], [place] [phone]   [facility] [address] [place], [place] [phone] Admissions: 8am-3pm M-F  [facility] [address]    [place], [place] [phone]   [facility] [address], [place], [place] [phone]   [facility] [address]  [place], [place] [phone]   [facility] - Intensive Outpatient [address], [place], [place] [phone]   [facility] ([facility] [address] [place] [address].,  [place], [place] [phone] or [phone]   [facility] ([facility]) [address], [place], [place] [phone] Accepts Medicaid  [facility] [place] [address],  [place] [phone] Substance Abuse/Addiction Treatment   [facility] Organization         Address  Phone  Notes  [facility]  [phone]   Raynelle Fanning  Alvan DameBrannon, PhD [address], [address] [place], [place]   [phone]) [phone] or [phone]) [phone]   [facility]    [address] [place], [place] [phone]) [phone]   [facility] [address], [place], [place] [phone] Insurance/Medicaid/sponsorship through [facility]  [facility] Families [address]                                    [place], [place] [phone] Therapy/tele-psych/case  [facility] [address]   [place], [place] [phone]) [phone]    Dr. Lolly MustacheArfeen  [phone]   [facility]  [employer]  [facility] 1) [address], [place] 2) [address], [place] 3)  [address], [place] [phone]) [phone]  [phone](336) [phone]   [facility] Child Abuse Hotline [phone]) [phone] or [phone]) [phone] (After Hours)

## 2015-09-04 LAB — URINE CULTURE: Culture: >=100000

## 2015-09-05 ENCOUNTER — Telehealth (HOSPITAL_BASED_OUTPATIENT_CLINIC_OR_DEPARTMENT_OTHER): Payer: Self-pay | Admitting: Emergency Medicine

## 2015-09-05 NOTE — Progress Notes (Signed)
ED Antimicrobial Stewardship Positive Culture Follow Up   Nichole Mendez is an [age] y.o. female who [facility] with a chief complaint of  Chief Complaint  Patient presents with  . Numbness    Recent Results (from the past 720 hour(s))  Urine culture     Status: None   Collection Time: 09/02/15 10:39 AM  Result Value Ref Range Status   Specimen Description URINE, CLEAN CATCH  Final   Special Requests NONE  Final   Culture >=100,000 COLONIES/mL ESCHERICHIA COLI  Final   Report Status 09/04/2015 FINAL  Final   Organism ID, Bacteria ESCHERICHIA COLI  Final      Susceptibility   Escherichia coli - MIC*    AMPICILLIN 4 SENSITIVE Sensitive     CEFAZOLIN <=4 SENSITIVE Sensitive     CEFTRIAXONE <=1 SENSITIVE Sensitive     CIPROFLOXACIN <=0.25 SENSITIVE Sensitive     GENTAMICIN <=1 SENSITIVE Sensitive     IMIPENEM <=0.25 SENSITIVE Sensitive     NITROFURANTOIN <=16 SENSITIVE Sensitive     TRIMETH/SULFA <=20 SENSITIVE Sensitive     AMPICILLIN/SULBACTAM 4 SENSITIVE Sensitive     PIP/TAZO <=4 SENSITIVE Sensitive     * >=100,000 COLONIES/mL ESCHERICHIA COLI    Pt presents with sore throat, bilateral ear pain, cough and throbbing headache. Likely URI per ED provider note. Also complains of malodorous urine x1 the morning of ED visit. Patient denies UTI symptoms of dysuria, hematuria, abdominal pain, or vaginal discharge. Patient was not discharged from ED with outpatient ABX. Discussed patient with Melburn HakeNicole Nadeau, PA-C and agrees patient does not need treatment at this time.  ED Provider: Melburn HakeNicole Nadeau, PA-C  Reuel Derbyobert J Corlis Angelica, PharmD 09/05/2015, 9:58 AM PGY1 Resident Phone# [phone]

## 2015-09-05 NOTE — Telephone Encounter (Signed)
Post ED Visit - Positive Culture Follow-up  Culture report reviewed by antimicrobial stewardship pharmacist:  []  Nichole Mendez, Pharm.D. []  Nichole Mendez, Pharm.D., BCPS []  Nichole Mendez, Pharm.D. []  Nichole Mendez, Pharm.D., BCPS []  Nichole Mendez, [address] BoulevardPharm.D., BCPS, AAHIVP []  Nichole Mendez, Pharm.D., BCPS, AAHIVP []  Nichole Mendez, Pharm.D. [x]  Nichole Mendez, [place]arm.D.  Positive urine culture E. coli Treated with none, asymptomatic and no further patient follow-up is required at this time.  Nichole Mendez, Nichole Mendez 09/05/2015, 10:37 AM

## 2016-01-21 ENCOUNTER — Ambulatory Visit (INDEPENDENT_AMBULATORY_CARE_PROVIDER_SITE_OTHER): Payer: BLUE CROSS/BLUE SHIELD

## 2016-01-21 ENCOUNTER — Ambulatory Visit (INDEPENDENT_AMBULATORY_CARE_PROVIDER_SITE_OTHER): Payer: BLUE CROSS/BLUE SHIELD | Admitting: Family Medicine

## 2016-01-21 DIAGNOSIS — R109 Unspecified abdominal pain: Secondary | ICD-10-CM

## 2016-01-21 DIAGNOSIS — R1012 Left upper quadrant pain: Secondary | ICD-10-CM | POA: Diagnosis not present

## 2016-01-21 DIAGNOSIS — M25512 Pain in left shoulder: Secondary | ICD-10-CM | POA: Diagnosis not present

## 2016-01-21 DIAGNOSIS — M542 Cervicalgia: Secondary | ICD-10-CM

## 2016-01-21 DIAGNOSIS — R10A2 Flank pain, left side: Secondary | ICD-10-CM

## 2016-01-21 LAB — POCT URINALYSIS DIP (MANUAL ENTRY)
Bilirubin, UA: NEGATIVE
Glucose, UA: NEGATIVE
LEUKOCYTES UA: NEGATIVE
Nitrite, UA: NEGATIVE
PROTEIN UA: NEGATIVE
Spec Grav, UA: 1.01
Urobilinogen, UA: 0.2
pH, UA: 7

## 2016-01-21 MED ORDER — METHOCARBAMOL 500 MG PO TABS: ORAL_TABLET | ORAL | Status: DC

## 2016-01-21 NOTE — Patient Instructions (Addendum)
Take naproxen 500 mg one twice daily as needed for pain.  In addition to that you can take Tylenol (acetaminophen) 500 mg 2 pills 3 times daily if needed.  Take the methocarbamol (Robaxin) muscle relaxant 2 at bedtime. You also can take 1 in the morning and 1 in the afternoon if needed, but may make you a little bit drowsy.  Return if worse or not improving    IF you received an x-ray today, you will receive an invoice from [facility]. Please contact [facility] at [phone] with questions or concerns regarding your invoice.   IF you received labwork today, you will receive an invoice from [facility]. Please contact [facility] at [phone] with questions or concerns regarding your invoice.   Our billing staff will not be able to assist you with questions regarding bills from these companies.  You will be contacted with the lab results as soon as they are available. The fastest way to get your results is to activate your My Chart account. Instructions are located on the last page of this paperwork. If you have not heard from us regarding the results in 2 weeks, please contact this office.
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
## 2016-03-02 ENCOUNTER — Telehealth: Payer: Self-pay

## 2016-03-02 NOTE — Telephone Encounter (Signed)
Pt states she was seen back in march and was given a school note -she now needs the note extended one day from return date on original letter   Best number [phone]

## 2016-03-03 NOTE — Telephone Encounter (Signed)
Done and faxed

## 2016-11-27 ENCOUNTER — Ambulatory Visit (INDEPENDENT_AMBULATORY_CARE_PROVIDER_SITE_OTHER): Payer: BLUE CROSS/BLUE SHIELD

## 2016-11-27 ENCOUNTER — Ambulatory Visit (INDEPENDENT_AMBULATORY_CARE_PROVIDER_SITE_OTHER): Payer: BLUE CROSS/BLUE SHIELD | Admitting: Physician Assistant

## 2016-11-27 VITALS — BP 100/64 | HR 84 | Temp 99.2°F | Resp 20 | Ht 66.0 in | Wt 131.0 lb

## 2016-11-27 DIAGNOSIS — R000 Tachycardia, unspecified: Secondary | ICD-10-CM

## 2016-11-27 DIAGNOSIS — R0989 Other specified symptoms and signs involving the circulatory and respiratory systems: Secondary | ICD-10-CM | POA: Diagnosis not present

## 2016-11-27 DIAGNOSIS — J101 Influenza due to other identified influenza virus with other respiratory manifestations: Secondary | ICD-10-CM

## 2016-11-27 DIAGNOSIS — J111 Influenza due to unidentified influenza virus with other respiratory manifestations: Secondary | ICD-10-CM

## 2016-11-27 DIAGNOSIS — R69 Illness, unspecified: Secondary | ICD-10-CM

## 2016-11-27 DIAGNOSIS — E860 Dehydration: Secondary | ICD-10-CM | POA: Diagnosis not present

## 2016-11-27 LAB — POCT URINALYSIS DIP (MANUAL ENTRY)
Glucose, UA: NEGATIVE
Leukocytes, UA: NEGATIVE
Nitrite, UA: NEGATIVE
Protein Ur, POC: 100 — AB
Spec Grav, UA: 1.025
Urobilinogen, UA: 4
pH, UA: 7

## 2016-11-27 LAB — CBC WITH DIFFERENTIAL/PLATELET
Basophils Absolute: 0 10*3/uL (ref 0.0–0.2)
Basos: 1 %
EOS (ABSOLUTE): 0.1 10*3/uL (ref 0.0–0.4)
Eos: 4 %
Hematocrit: 40.3 % (ref 34.0–46.6)
Hemoglobin: 14 g/dL (ref 11.1–15.9)
Lymphocytes Absolute: 1.5 10*3/uL (ref 0.7–3.1)
Lymphs: 51 %
MCH: 31.2 pg (ref 26.6–33.0)
MCHC: 34.7 g/dL (ref 31.5–35.7)
MCV: 90 fL (ref 79–97)
Monocytes Absolute: 0.6 10*3/uL (ref 0.1–0.9)
Monocytes: 21 %
Neutrophils Absolute: 0.7 10*3/uL — ABNORMAL LOW (ref 1.4–7.0)
Neutrophils: 19 %
Platelets: 213 10*3/uL (ref 150–379)
RBC: 4.49 x10E6/uL (ref 3.77–5.28)
RDW: 12.7 % (ref 12.3–15.4)
WBC: 3 10*3/uL — ABNORMAL LOW (ref 3.4–10.8)

## 2016-11-27 LAB — POCT INFLUENZA A/B
Influenza A, POC: POSITIVE — AB
Influenza B, POC: NEGATIVE

## 2016-11-27 LAB — POCT URINE PREGNANCY: Preg Test, Ur: NEGATIVE

## 2016-11-27 LAB — IMMATURE CELLS: Bands(Auto) Relative: 4 %

## 2016-11-27 MED ORDER — ALBUTEROL SULFATE (2.5 MG/3ML) 0.083% IN NEBU
2.5000 mg | INHALATION_SOLUTION | RESPIRATORY_TRACT | Status: AC
  Administered 2016-11-27: 2.5 mg via RESPIRATORY_TRACT

## 2016-11-27 MED ORDER — IPRATROPIUM BROMIDE 0.02 % IN SOLN
0.5000 mg | Freq: Once | RESPIRATORY_TRACT | Status: AC
  Administered 2016-11-27: 0.5 mg via RESPIRATORY_TRACT

## 2016-11-27 MED ORDER — OSELTAMIVIR PHOSPHATE 75 MG PO CAPS: 75.0000 mg | ORAL_CAPSULE | Freq: Two times a day (BID) | ORAL | 0 refills | Status: DC

## 2016-11-27 MED ORDER — ALBUTEROL SULFATE (2.5 MG/3ML) 0.083% IN NEBU
2.5000 mg | INHALATION_SOLUTION | Freq: Once | RESPIRATORY_TRACT | Status: AC
  Administered 2016-11-27: 2.5 mg via RESPIRATORY_TRACT

## 2016-11-27 MED ORDER — ALBUTEROL SULFATE HFA 108 (90 BASE) MCG/ACT IN AERS: 2.0000 | INHALATION_SPRAY | Freq: Four times a day (QID) | RESPIRATORY_TRACT | 0 refills | Status: DC | PRN

## 2016-11-27 NOTE — Progress Notes (Signed)
I want to recheck a CBC in about 1 month.  Please add on a future blood draw only. Diagnosis lymphopenia. Deliah BostonMichael Lason Eveland, MS, PA-C 5:16 PM, 11/27/2016

## 2016-11-27 NOTE — Progress Notes (Signed)
11/30/2016 2:15 PM   DOB: Jul 13, 1996 / MRN: [id number]  SUBJECTIVE:  Nichole Mendez is a 21 y.o. female presenting for 3 days of cough, myalgia, HA and fever.  She has been exposed to flu via one of her friends. She denies chest pain at this time.  She is short of breath.  No history of asthma or chronic illness.  She has not tried taking anything for this illness thus far.  She is taking birth control via Nexplanon. She does not smoke. She feels that she is getting worse.    She is allergic to cephalosporins and toradol [ketorolac tromethamine].   She  has a past medical history of Ruptured ovarian cyst.    She  reports that she has never smoked. She does not have any smokeless tobacco history on file. She reports that she does not drink alcohol or use drugs. She  has no sexual activity history on file. The patient  has no past surgical history on file.  Her family history is not on file.  Review of Systems  Constitutional: Positive for chills, fever and malaise/fatigue.  HENT: Negative for hearing loss.   Respiratory: Positive for cough and shortness of breath. Negative for hemoptysis, sputum production and wheezing.   Cardiovascular: Negative for chest pain, palpitations and leg swelling.  Skin: Negative for rash.  Neurological: Negative for dizziness and weakness.    The problem list and medications were reviewed and updated by myself where necessary and exist elsewhere in the encounter.   OBJECTIVE:  BP 100/64 (BP Location: Right Arm, Cuff Size: Normal)   Pulse 84   Temp 99.2 F (37.3 C) (Oral)   Resp 20   Ht 5\' 6"  (1.676 m)   Wt 131 lb (59.4 kg)   SpO2 100%   BMI 21.14 kg/m   Physical Exam  Constitutional: She is oriented to person, place, and time.  Cardiovascular: Regular rhythm and normal heart sounds.   Pulmonary/Chest: Effort normal and breath sounds normal. No respiratory distress. She has no wheezes. She has no rales. She exhibits no tenderness.   Musculoskeletal: Normal range of motion. She exhibits no edema or tenderness.  Neurological: She is alert and oriented to person, place, and time.  Skin: Skin is warm and dry.  Psychiatric: She has a normal mood and affect.    Results for orders placed or performed in visit on 11/27/16 (from the past 72 hour(s))  POCT Influenza A/B     Status: Abnormal   Collection Time: 11/27/16  2:20 PM  Result Value Ref Range   Influenza A, POC Positive (A) Negative   Influenza B, POC Negative Negative  CBC with Differential/Platelet     Status: Abnormal   Collection Time: 11/27/16  2:24 PM  Result Value Ref Range   WBC 3.0 (L) 3.4 - 10.8 x10E3/uL   RBC 4.49 3.77 - 5.28 x10E6/uL    Comment: Minor variation in size and shape.   Hemoglobin 14.0 11.1 - 15.9 g/dL   Hematocrit 78.2 95.6 - 46.6 %   MCV 90 79 - 97 fL   MCH 31.2 26.6 - 33.0 pg   MCHC 34.7 31.5 - 35.7 g/dL   RDW 21.3 08.6 - 57.8 %   Platelets 213 150 - 379 x10E3/uL    Comment: Adequate   Neutrophils 19 Not Estab. %   Lymphs 51 Not Estab. %   Monocytes 21 Not Estab. %   Eos 4 Not Estab. %   Basos 1 Not Estab. %  IMMATURE CELLS Note    Neutrophils Absolute 0.7 (L) 1.4 - 7.0 x10E3/uL    Comment: Please note: Neut absolute includes segmented neutrophils and bands   Lymphocytes Absolute 1.5 0.7 - 3.1 x10E3/uL   Monocytes Absolute 0.6 0.1 - 0.9 x10E3/uL   EOS (ABSOLUTE) 0.1 0.0 - 0.4 x10E3/uL   Basophils Absolute 0.0 0.0 - 0.2 x10E3/uL   Hematology Comments: Note:     Comment: Manual differential was performed. Verified by microscopic examination.   Immature Cells     Status: None   Collection Time: 11/27/16  2:24 PM  Result Value Ref Range   Bands(Auto) Relative 4 Not Estab. %  POCT urinalysis dipstick     Status: Abnormal   Collection Time: 11/27/16  2:37 PM  Result Value Ref Range   Color, UA yellow yellow   Clarity, UA clear clear   Glucose, UA negative negative   Bilirubin, UA small (A) negative   Ketones, POC UA  small (15) (A) negative   Spec Grav, UA 1.025    Blood, UA moderate (A) negative   pH, UA 7.0    Protein Ur, POC =100 (A) negative   Urobilinogen, UA 4.0    Nitrite, UA Negative Negative   Leukocytes, UA Negative Negative  POCT urine pregnancy     Status: None   Collection Time: 11/27/16  2:39 PM  Result Value Ref Range   Preg Test, Ur Negative Negative    No results found.  ASSESSMENT AND PLAN:  Diagnoses and all orders for this visit:  Influenza-like illness -     POCT Influenza A/B -     CBC with Differential/Platelet  Tachycardia: Resovled with fluids.  Tamiflu on board now at roughly 2:30 pm and her next dose will be tonight.  She has an allergy to toradol.  Advised she take her naprosyn, which she already has a prescription.  If SOB tonight then she will need to go to the ED.  RTC tomorrow if not tolerating PO.   -     Cancel: CBC -     DG Chest 2 View; Future -     POCT urinalysis dipstick -     POCT urine pregnancy -     EKG 12-Lead  Dehydration -     Insert peripheral IV  Abnormally low peak expiratory flow rate -     albuterol (PROVENTIL) (2.5 MG/3ML) 0.083% nebulizer solution 2.5 mg; Take 3 mLs (2.5 mg total) by nebulization once. -     ipratropium (ATROVENT) nebulizer solution 0.5 mg; Take 2.5 mLs (0.5 mg total) by nebulization once. -     albuterol (PROVENTIL) (2.5 MG/3ML) 0.083% nebulizer solution 2.5 mg; Take 3 mLs (2.5 mg total) by nebulization now.  Influenza A -     oseltamivir (TAMIFLU) 75 MG capsule; Take 1 capsule (75 mg total) by mouth 2 (two) times daily.  Other orders -     albuterol (PROVENTIL HFA;VENTOLIN HFA) 108 (90 Base) MCG/ACT inhaler; Inhale 2 puffs into the lungs every 6 (six) hours as needed for wheezing or shortness of breath.    The patient is advised to call or return to clinic if she does not see an improvement in symptoms, or to seek the care of the closest emergency department if she worsens with the above plan.   Deliah BostonMichael Eymi Lipuma,  MHS, PA-C [facility] 11/30/2016 2:15 PM

## 2016-12-01 ENCOUNTER — Ambulatory Visit (INDEPENDENT_AMBULATORY_CARE_PROVIDER_SITE_OTHER): Payer: BLUE CROSS/BLUE SHIELD | Admitting: Physician Assistant

## 2016-12-01 ENCOUNTER — Telehealth: Payer: Self-pay | Admitting: Emergency Medicine

## 2016-12-01 VITALS — BP 104/60 | HR 83 | Temp 98.1°F | Resp 18 | Ht 66.0 in | Wt 131.0 lb

## 2016-12-01 DIAGNOSIS — J029 Acute pharyngitis, unspecified: Secondary | ICD-10-CM

## 2016-12-01 DIAGNOSIS — R05 Cough: Secondary | ICD-10-CM

## 2016-12-01 DIAGNOSIS — R058 Other specified cough: Secondary | ICD-10-CM

## 2016-12-01 DIAGNOSIS — D72810 Lymphocytopenia: Secondary | ICD-10-CM

## 2016-12-01 LAB — POCT CBC
Granulocyte percent: 28.4 %G — AB (ref 37–80)
HCT, POC: 41.6 % (ref 37.7–47.9)
Hemoglobin: 14.5 g/dL (ref 12.2–16.2)
LYMPH, POC: 2 (ref 0.6–3.4)
MCH, POC: 31.8 pg — AB (ref 27–31.2)
MCHC: 34.9 g/dL (ref 31.8–35.4)
MCV: 91.1 fL (ref 80–97)
MID (CBC): 0.2 (ref 0–0.9)
MPV: 7.7 fL (ref 0–99.8)
POC GRANULOCYTE: 0.9 — AB (ref 2–6.9)
POC LYMPH PERCENT: 65.3 %L — AB (ref 10–50)
POC MID %: 6.3 %M (ref 0–12)
Platelet Count, POC: 225 10*3/uL (ref 142–424)
RBC: 4.57 M/uL (ref 4.04–5.48)
RDW, POC: 12.1 %
WBC: 3.1 10*3/uL — AB (ref 4.6–10.2)

## 2016-12-01 LAB — POCT RAPID STREP A (OFFICE): RAPID STREP A SCREEN: NEGATIVE

## 2016-12-01 MED ORDER — HYDROCODONE-HOMATROPINE 5-1.5 MG/5ML PO SYRP: 5.0000 mL | ORAL_SOLUTION | Freq: Three times a day (TID) | ORAL | 0 refills | Status: DC | PRN

## 2016-12-01 MED ORDER — PREDNISONE 50 MG PO TABS: ORAL_TABLET | ORAL | 0 refills | Status: DC

## 2016-12-01 NOTE — Patient Instructions (Signed)
     IF you received an x-ray today, you will receive an invoice from [facility]. Please contact [facility] at [phone] with questions or concerns regarding your invoice.   IF you received labwork today, you will receive an invoice from [facility]. Please contact [facility] at [phone] with questions or concerns regarding your invoice.   Our billing staff will not be able to assist you with questions regarding bills from these companies.  You will be contacted with the lab results as soon as they are available. The fastest way to get your results is to activate your My Chart account. Instructions are located on the last page of this paperwork. If you have not heard from us regarding the results in 2 weeks, please contact this office.     

## 2016-12-01 NOTE — Progress Notes (Signed)
12/01/2016 2:38 PM   DOB: 1996-02-21 / MRN: [id number]  SUBJECTIVE:  Nichole Mendez is a 21 y.o. female presenting for cough and sore throat.  I treated her with tamiflu roughly 4 days ago.  She feels better overall, however continues to have the above symptoms. She did take 220 mg of aleve today in the morning.   She is allergic to cephalosporins and toradol [ketorolac tromethamine].   She  has a past medical history of Ruptured ovarian cyst.    She  reports that she has never smoked. She has never used smokeless tobacco. She reports that she does not drink alcohol or use drugs. She  has no sexual activity history on file. The patient  has no past surgical history on file.  Her family history is not on file.  Review of Systems  Constitutional: Negative for chills and fever.  HENT: Positive for sore throat.   Respiratory: Positive for cough.   Cardiovascular: Negative for chest pain and leg swelling.  Gastrointestinal: Negative for abdominal pain, constipation, diarrhea and nausea.  Skin: Negative for rash.  Neurological: Positive for dizziness. Negative for headaches.    The problem list and medications were reviewed and updated by myself where necessary and exist elsewhere in the encounter.   OBJECTIVE:  BP 104/60   Pulse 83   Temp 98.1 F (36.7 C) (Oral)   Resp 18   Ht 5\' 6"  (1.676 m)   Wt 131 lb (59.4 kg)   SpO2 99%   BMI 21.14 kg/m   Physical Exam  Constitutional: She appears well-developed and well-nourished. No distress.  Cardiovascular: Normal rate, regular rhythm and normal heart sounds.   Pulmonary/Chest: Effort normal and breath sounds normal. No respiratory distress. She has no wheezes. She has no rales. She exhibits no tenderness.  Skin: She is not diaphoretic.  Vitals reviewed.   Results for orders placed or performed in visit on 12/01/16 (from the past 72 hour(s))  POCT CBC     Status: Abnormal   Collection Time: 12/01/16  2:19 PM  Result Value  Ref Range   WBC 3.1 (A) 4.6 - 10.2 K/uL   Lymph, poc 2.0 0.6 - 3.4   POC LYMPH PERCENT 65.3 (A) 10 - 50 %L   MID (cbc) 0.2 0 - 0.9   POC MID % 6.3 0 - 12 %M   POC Granulocyte 0.9 (A) 2 - 6.9   Granulocyte percent 28.4 (A) 37 - 80 %G   RBC 4.57 4.04 - 5.48 M/uL   Hemoglobin 14.5 12.2 - 16.2 g/dL   HCT, POC 40.941.6 81.137.7 - 47.9 %   MCV 91.1 80 - 97 fL   MCH, POC 31.8 (A) 27 - 31.2 pg   MCHC 34.9 31.8 - 35.4 g/dL   RDW, POC 91.412.1 %   Platelet Count, POC 225 142 - 424 K/uL   MPV 7.7 0 - 99.8 fL  POCT rapid strep A     Status: None   Collection Time: 12/01/16  2:23 PM  Result Value Ref Range   Rapid Strep A Screen Negative Negative    No results found.  ASSESSMENT AND PLAN:  Nichole Mendez was seen today for follow-up and sore throat.  Diagnoses and all orders for this visit:  Post-viral cough syndrome: CBC appears viral.  Will start pred given cough. Rapid strep is negative.  -     POCT CBC -     predniSONE (DELTASONE) 50 MG tablet; Take one daily in the morning. Do  not take aleve, ibuprofen, aspirin while taking this. -     HYDROcodone-homatropine (HYCODAN) 5-1.5 MG/5ML syrup; Take 5 mLs by mouth every 8 (eight) hours as needed for cough.  Sore throat -     POCT rapid strep A -     Culture, Group A Strep -     predniSONE (DELTASONE) 50 MG tablet; Take one daily in the morning. Do not take aleve, ibuprofen, aspirin while taking this.    The patient is advised to call or return to clinic if she does not see an improvement in symptoms, or to seek the care of the closest emergency department if she worsens with the above plan.   Deliah Boston, MHS, PA-C [facility] 12/01/2016 2:38 PM

## 2016-12-01 NOTE — Telephone Encounter (Signed)
-----   Message from Ofilia NeasMichael L Clark, PA-C sent at 11/27/2016  5:16 PM EST ----- I want to recheck a CBC in about 1 month.  Please add on a future blood draw only. Diagnosis lymphopenia. Deliah BostonMichael Clark, MS, PA-C 5:16 PM, 11/27/2016

## 2016-12-04 LAB — CULTURE, GROUP A STREP

## 2017-02-01 IMAGING — DX DG CHEST 2V
2 series · 2 of 2 positions shown · non-contrast
Comparison: None.

CLINICAL DATA: Cough.  Tachycardia.

EXAM:
CHEST  2 VIEW

[chest pa]
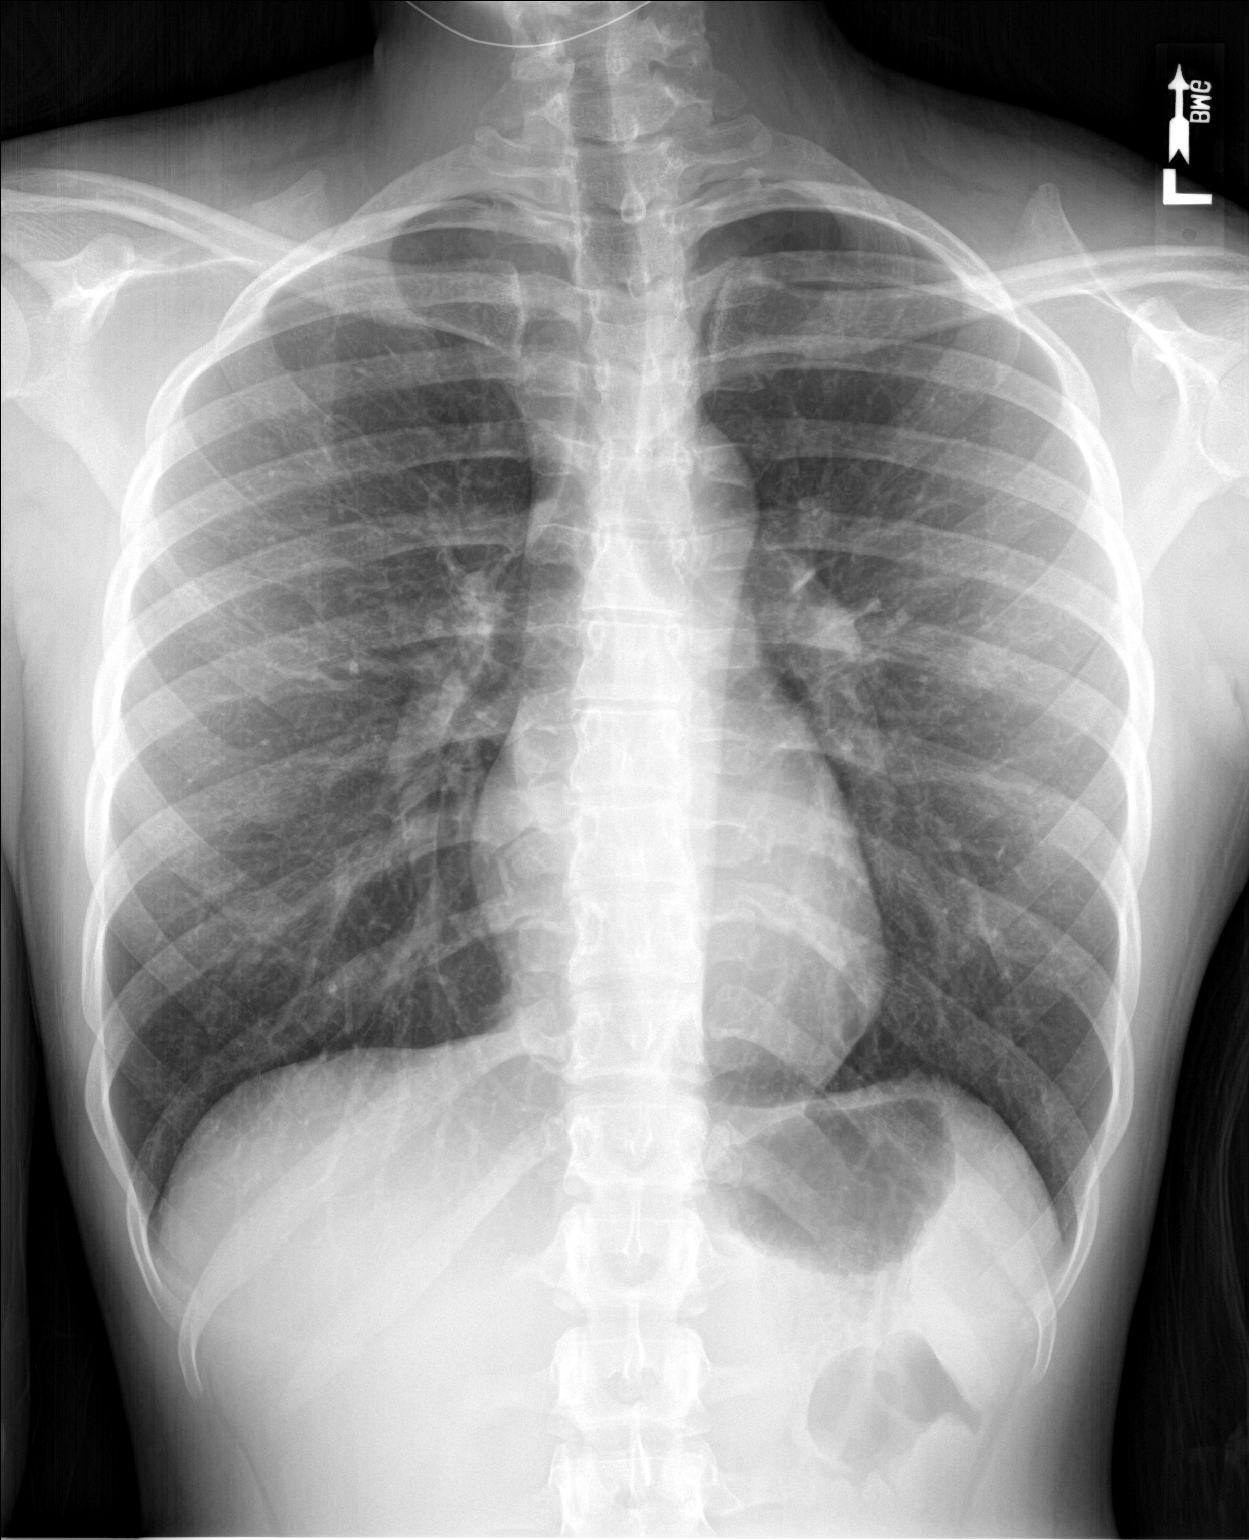

[chest lat]
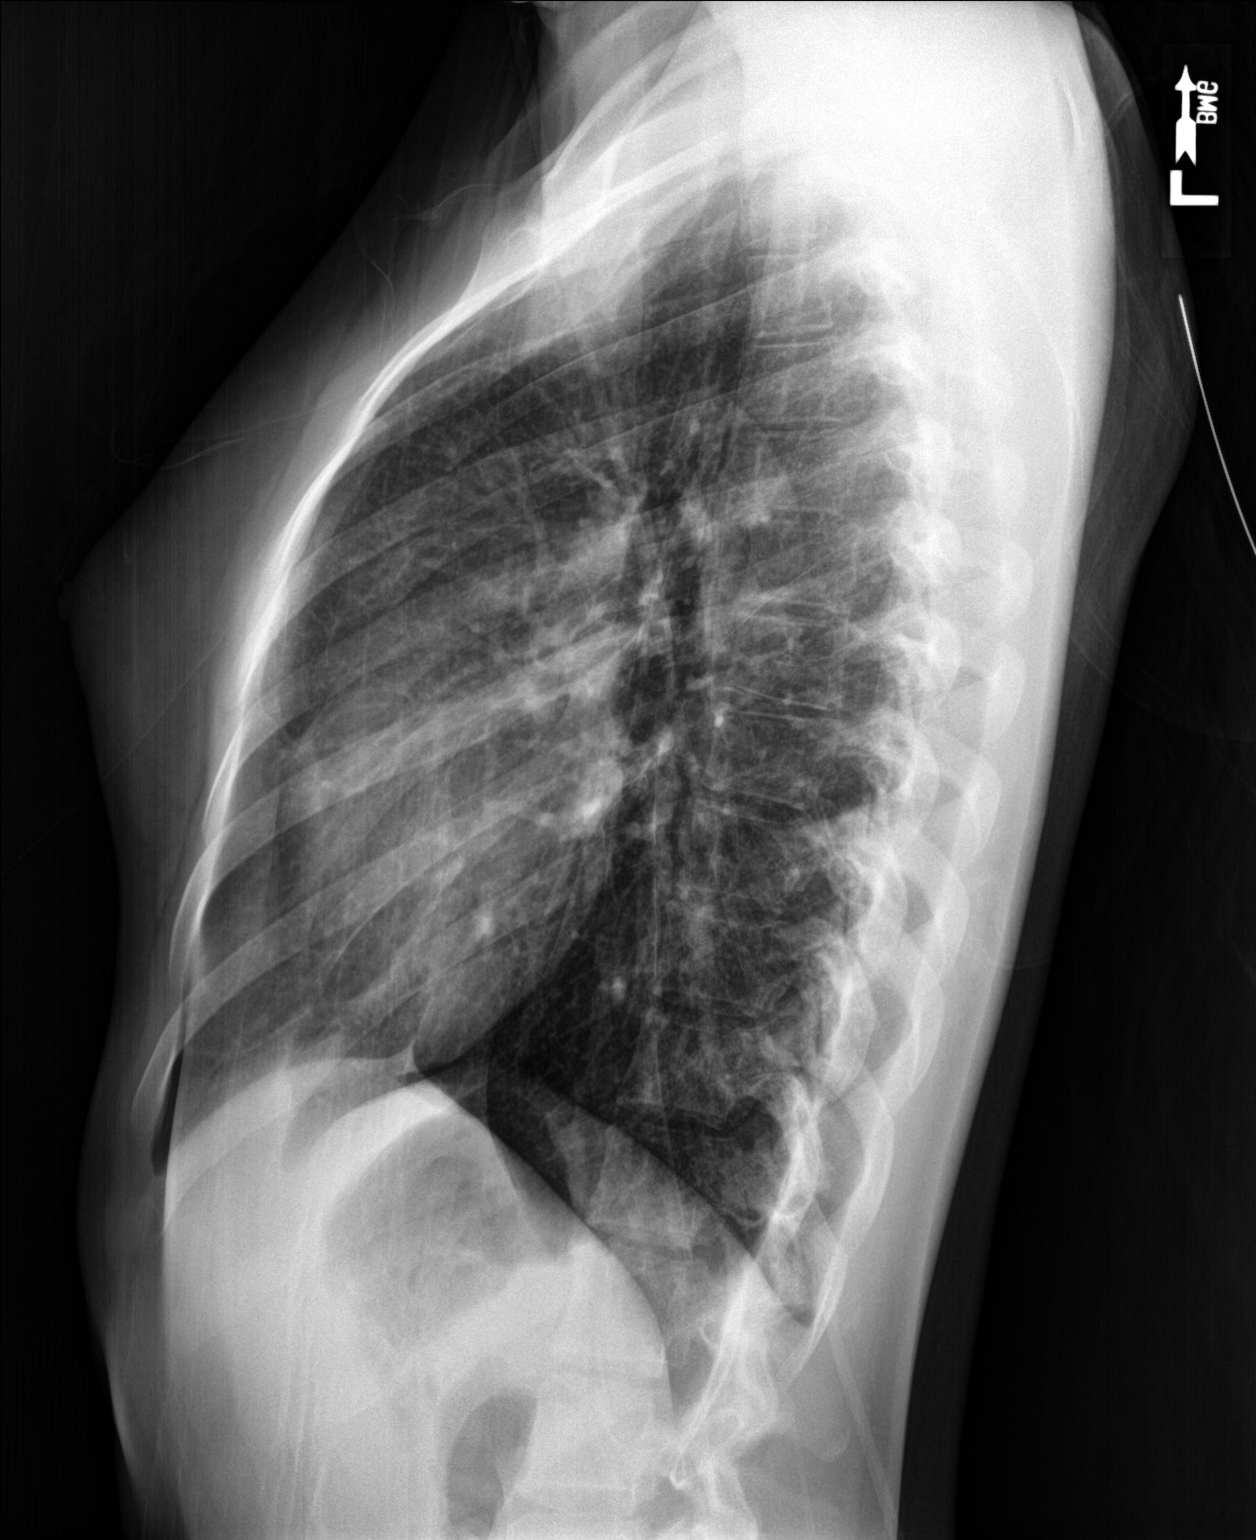

[2 of 2 positions shown; findings below may reference images not displayed]

FINDINGS: The heart size and mediastinal contours are within normal limits.
Both lungs are clear. No pleural effusion or pneumothorax. The
visualized skeletal structures are unremarkable.
IMPRESSION: Normal chest radiographs.

## 2017-06-16 ENCOUNTER — Ambulatory Visit: Payer: BLUE CROSS/BLUE SHIELD | Admitting: Physician Assistant

## 2017-06-16 ENCOUNTER — Encounter: Payer: Self-pay | Admitting: Family Medicine

## 2017-06-16 ENCOUNTER — Ambulatory Visit (INDEPENDENT_AMBULATORY_CARE_PROVIDER_SITE_OTHER): Payer: BLUE CROSS/BLUE SHIELD | Admitting: Family Medicine

## 2017-06-16 VITALS — BP 108/72 | HR 71 | Temp 97.9°F | Resp 18 | Ht 66.0 in | Wt 138.6 lb

## 2017-06-16 DIAGNOSIS — Z23 Encounter for immunization: Secondary | ICD-10-CM | POA: Diagnosis not present

## 2017-06-16 NOTE — Progress Notes (Signed)
   Subjective:  By signing my name below, I, [name], attest that this documentation has been prepared under the direction and in the presence of Norberto SorensonEva Zamaya Rapaport, MD Electronically Signed: Charline BillsEssence Howell, ED Scribe 06/16/2017 at 3:23 PM   Patient ID: Nichole KempsShaina Elizabeth Mendez, female    DOB: 08/23/1996, 21 y.o.   MRN: [id number]  Chief Complaint  Patient presents with  . Tdap    Pt requesting Tdap for school.   HPI Nichole KempsShaina Elizabeth Eades is a 21 y.o. female who presents to [facility] at [place] requesting a tdap for school.   Past Medical History:  Diagnosis Date  . Ruptured ovarian cyst    Current Outpatient Prescriptions on File Prior to Visit  Medication Sig Dispense Refill  . albuterol (PROVENTIL HFA;VENTOLIN HFA) 108 (90 Base) MCG/ACT inhaler Inhale 2 puffs into the lungs every 6 (six) hours as needed for wheezing or shortness of breath. (Patient not taking: Reported on 12/01/2016) 1 Inhaler 0  . HYDROcodone-homatropine (HYCODAN) 5-1.5 MG/5ML syrup Take 5 mLs by mouth every 8 (eight) hours as needed for cough. (Patient not taking: Reported on 06/16/2017) 120 mL 0  . methocarbamol (ROBAXIN) 500 MG tablet Take one in the morning, one in afternoon and 2 at bedtime as needed for muscle relaxant (Patient not taking: Reported on 12/01/2016) 30 tablet 0  . naproxen (NAPROSYN) 500 MG tablet Take 1 tablet (500 mg total) by mouth 2 (two) times daily. (Patient not taking: Reported on 12/01/2016) 20 tablet 0  . oseltamivir (TAMIFLU) 75 MG capsule Take 1 capsule (75 mg total) by mouth 2 (two) times daily. (Patient not taking: Reported on 06/16/2017) 10 capsule 0  . predniSONE (DELTASONE) 50 MG tablet Take one daily in the morning. Do not take aleve, ibuprofen, aspirin while taking this. (Patient not taking: Reported on 06/16/2017) 5 tablet 0   No current facility-administered medications on file prior to visit.    Allergies  Allergen Reactions  . Cephalosporins   . Toradol [Ketorolac Tromethamine]      Review of Systems  Constitutional: Negative for fever.      Objective:   Physical Exam   BP 108/72 (BP Location: Right Arm, Patient Position: Sitting, Cuff Size: Normal)   Pulse 71   Temp 97.9 F (36.6 C) (Oral)   Resp 18   Ht 5\' 6"  (1.676 m)   Wt 138 lb 9.6 oz (62.9 kg)   SpO2 99%   BMI 22.37 kg/m     Assessment & Plan:   1. Need for tetanus booster     Orders Placed This Encounter  Procedures  . Tdap vaccine greater than or equal to 7yo IM     I personally performed the services described in this documentation, which was scribed in my presence. The recorded information has been reviewed and considered, and addended by me as needed.   Norberto SorensonEva Tramel Westbrook, M.D.  [facility]  [place] [address] [place] [address] [place], [place] [address] [phone] phone [phone] fax  06/19/17 1:01 AM

## 2017-06-16 NOTE — Patient Instructions (Addendum)
   IF you received an x-ray today, you will receive an invoice from [facility]. Please contact [facility] at [phone] with questions or concerns regarding your invoice.   IF you received labwork today, you will receive an invoice from [facility]. Please contact [facility] at [phone] with questions or concerns regarding your invoice.   Our billing staff will not be able to assist you with questions regarding bills from these companies.  You will be contacted with the lab results as soon as they are available. The fastest way to get your results is to activate your My Chart account. Instructions are located on the last page of this paperwork. If you have not heard from us regarding the results in 2 weeks, please contact this office.    Tdap Vaccine (Tetanus, Diphtheria and Pertussis): What You Need to Know 1. Why get vaccinated? Tetanus, diphtheria and pertussis are very serious diseases. Tdap vaccine can protect us from these diseases. And, Tdap vaccine given to pregnant women can protect newborn babies against pertussis. TETANUS (Lockjaw) is rare in the [place] today. It causes painful muscle tightening and stiffness, usually all over the body.  It can lead to tightening of muscles in the head and neck so you can't open your mouth, swallow, or sometimes even breathe. Tetanus kills about 1 out of 10 people who are infected even after receiving the best medical care.  DIPHTHERIA is also rare in the [place] today. It can cause a thick coating to form in the back of the throat.  It can lead to breathing problems, heart failure, paralysis, and death.  PERTUSSIS (Whooping Cough) causes severe coughing spells, which can cause difficulty breathing, vomiting and disturbed sleep.  It can also lead to weight loss, incontinence, and rib fractures. Up to 2 in 100 adolescents and 5 in 100 adults with pertussis are hospitalized or have complications, which could  include pneumonia or death.  These diseases are caused by bacteria. Diphtheria and pertussis are spread from person to person through secretions from coughing or sneezing. Tetanus enters the body through cuts, scratches, or wounds. Before vaccines, as many as 200,000 cases of diphtheria, 200,000 cases of pertussis, and hundreds of cases of tetanus, were reported in the [place] each year. Since vaccination began, reports of cases for tetanus and diphtheria have dropped by about 99% and for pertussis by about 80%. 2. Tdap vaccine Tdap vaccine can protect adolescents and adults from tetanus, diphtheria, and pertussis. One dose of Tdap is routinely given at age 11 or 12. People who did not get Tdap at that age should get it as soon as possible. Tdap is especially important for healthcare professionals and anyone having close contact with a baby younger than 12 months. Pregnant women should get a dose of Tdap during every pregnancy, to protect the newborn from pertussis. Infants are most at risk for severe, life-threatening complications from pertussis. Another vaccine, called Td, protects against tetanus and diphtheria, but not pertussis. A Td booster should be given every 10 years. Tdap may be given as one of these boosters if you have never gotten Tdap before. Tdap may also be given after a severe cut or burn to prevent tetanus infection. Your doctor or the person giving you the vaccine can give you more information. Tdap may safely be given at the same time as other vaccines. 3. Some people should not get this vaccine  A person who has ever had a life-threatening allergic reaction after a previous   dose of any diphtheria, tetanus or pertussis containing vaccine, OR has a severe allergy to any part of this vaccine, should not get Tdap vaccine. Tell the person giving the vaccine about any severe allergies.  Anyone who had coma or long repeated seizures within 7 days after a childhood dose of DTP or  DTaP, or a previous dose of Tdap, should not get Tdap, unless a cause other than the vaccine was found. They can still get Td.  Talk to your doctor if you: ? have seizures or another nervous system problem, ? had severe pain or swelling after any vaccine containing diphtheria, tetanus or pertussis, ? ever had a condition called Guillain-Barr Syndrome (GBS), ? aren't feeling well on the day the shot is scheduled. 4. Risks With any medicine, including vaccines, there is a chance of side effects. These are usually mild and go away on their own. Serious reactions are also possible but are rare. Most people who get Tdap vaccine do not have any problems with it. Mild problems following Tdap: (Did not interfere with activities)  Pain where the shot was given (about 3 in 4 adolescents or 2 in 3 adults)  Redness or swelling where the shot was given (about 1 person in 5)  Mild fever of at least 100.4F (up to about 1 in 25 adolescents or 1 in 100 adults)  Headache (about 3 or 4 people in 10)  Tiredness (about 1 person in 3 or 4)  Nausea, vomiting, diarrhea, stomach ache (up to 1 in 4 adolescents or 1 in 10 adults)  Chills, sore joints (about 1 person in 10)  Body aches (about 1 person in 3 or 4)  Rash, swollen glands (uncommon)  Moderate problems following Tdap: (Interfered with activities, but did not require medical attention)  Pain where the shot was given (up to 1 in 5 or 6)  Redness or swelling where the shot was given (up to about 1 in 16 adolescents or 1 in 12 adults)  Fever over 102F (about 1 in 100 adolescents or 1 in 250 adults)  Headache (about 1 in 7 adolescents or 1 in 10 adults)  Nausea, vomiting, diarrhea, stomach ache (up to 1 or 3 people in 100)  Swelling of the entire arm where the shot was given (up to about 1 in 500).  Severe problems following Tdap: (Unable to perform usual activities; required medical attention)  Swelling, severe pain, bleeding and  redness in the arm where the shot was given (rare).  Problems that could happen after any vaccine:  People sometimes faint after a medical procedure, including vaccination. Sitting or lying down for about 15 minutes can help prevent fainting, and injuries caused by a fall. Tell your doctor if you feel dizzy, or have vision changes or ringing in the ears.  Some people get severe pain in the shoulder and have difficulty moving the arm where a shot was given. This happens very rarely.  Any medication can cause a severe allergic reaction. Such reactions from a vaccine are very rare, estimated at fewer than 1 in a million doses, and would happen within a few minutes to a few hours after the vaccination. As with any medicine, there is a very remote chance of a vaccine causing a serious injury or death. The safety of vaccines is always being monitored. For more information, visit: www.cdc.gov/vaccinesafety/ 5. What if there is a serious problem? What should I look for? Look for anything that concerns you, such as signs of a   severe allergic reaction, very high fever, or unusual behavior. Signs of a severe allergic reaction can include hives, swelling of the face and throat, difficulty breathing, a fast heartbeat, dizziness, and weakness. These would usually start a few minutes to a few hours after the vaccination. What should I do?  If you think it is a severe allergic reaction or other emergency that can't wait, call 9-1-1 or get the person to the nearest hospital. Otherwise, call your doctor.  Afterward, the reaction should be reported to the Vaccine Adverse Event Reporting System (VAERS). Your doctor might file this report, or you can do it yourself through the VAERS web site at www.vaers.hhs.gov, or by calling 1-800-822-7967. ? VAERS does not give medical advice. 6. The National Vaccine Injury Compensation Program The National Vaccine Injury Compensation Program (VICP) is a federal program that was  created to compensate people who may have been injured by certain vaccines. Persons who believe they may have been injured by a vaccine can learn about the program and about filing a claim by calling 1-800-338-2382 or visiting the VICP website at www.hrsa.gov/vaccinecompensation. There is a time limit to file a claim for compensation. 7. How can I learn more?  Ask your doctor. He or she can give you the vaccine package insert or suggest other sources of information.  Call your local or state health department.  Contact the Centers for Disease Control and Prevention (CDC): ? Call 1-800-232-4636 (1-800-CDC-INFO) or ? Visit CDC's website at www.cdc.gov/vaccines CDC Tdap Vaccine VIS (12/26/13) This information is not intended to replace advice given to you by your health care provider. Make sure you discuss any questions you have with your health care provider. Document Released: 04/19/2012 Document Revised: 07/09/2016 Document Reviewed: 07/09/2016 Elsevier Interactive Patient Education  2017 Elsevier Inc.  

## 2017-10-12 ENCOUNTER — Ambulatory Visit: Payer: Self-pay | Admitting: *Deleted

## 2017-10-12 NOTE — Telephone Encounter (Signed)
Pt called complaining of cough, runny nose, sore throat, chills, "coughing up brown stuff"; pressure in nasal area and periods of chills and sweats; she states that she has been taking Tylenol cold and flu; pt triaged per nurse protocol and was offered and accepted and appointment with Dr Norberto SorensonEva Shaw on 10/13/17 at 0820; pt verbalizes understanding. Reason for Disposition . Coughing up rusty-colored (reddish-brown) sputum  Answer Assessment - Initial Assessment Questions 1. ONSET: "When did the cough begin?"      Monday 10/11/17 cough started 2. SEVERITY: "How bad is the cough today?"     mild 3. RESPIRATORY DISTRESS: "Describe your breathing."      Breathing through mouth 4. FEVER: "Do you have a fever?" If so, ask: "What is your temperature, how was it measured, and when did it start?"     99.8 yesterday oral thermometer 5. SPUTUM: "Describe the color of your sputum" (clear, white, yellow, green)     Brown, thick  6. HEMOPTYSIS: "Are you coughing up any blood?" If so ask: "How much?" (flecks, streaks, tablespoons, etc.)     no 7. CARDIAC HISTORY: "Do you have any history of heart disease?" (e.g., heart attack, congestive heart failure)      no 8. LUNG HISTORY: "Do you have any history of lung disease?"  (e.g., pulmonary embolus, asthma, emphysema)     no 9. PE RISK FACTORS: "Do you have a history of blood clots?" (or: recent major surgery, recent prolonged travel, bedridden )     no 10. OTHER SYMPTOMS: "Do you have any other symptoms?" (e.g., runny nose, wheezing, chest pain)       Sore throat started on Sunday; chills and sweating 11. PREGNANCY: "Is there any chance you are pregnant?" "When was your last menstrual period?"       No nexplonon implant 12. TRAVEL: "Have you traveled out of the country in the last month?" (e.g., travel history, exposures)       no  Protocols used: COUGH - ACUTE PRODUCTIVE-A-AH

## 2017-10-13 ENCOUNTER — Other Ambulatory Visit: Payer: Self-pay

## 2017-10-13 ENCOUNTER — Ambulatory Visit: Payer: BLUE CROSS/BLUE SHIELD | Admitting: Family Medicine

## 2017-10-13 ENCOUNTER — Encounter: Payer: Self-pay | Admitting: Family Medicine

## 2017-10-13 ENCOUNTER — Ambulatory Visit: Payer: Self-pay | Admitting: Family Medicine

## 2017-10-13 VITALS — HR 78 | Temp 98.5°F | Resp 16 | Ht 66.0 in | Wt 142.2 lb

## 2017-10-13 DIAGNOSIS — J029 Acute pharyngitis, unspecified: Secondary | ICD-10-CM

## 2017-10-13 DIAGNOSIS — J069 Acute upper respiratory infection, unspecified: Secondary | ICD-10-CM | POA: Diagnosis not present

## 2017-10-13 LAB — POCT RAPID STREP A (OFFICE): RAPID STREP A SCREEN: NEGATIVE

## 2017-10-13 LAB — POC INFLUENZA A&B (BINAX/QUICKVUE)
Influenza A, POC: NEGATIVE
Influenza B, POC: NEGATIVE

## 2017-10-13 MED ORDER — HYDROCODONE-HOMATROPINE 5-1.5 MG/5ML PO SYRP: 5.0000 mL | ORAL_SOLUTION | Freq: Four times a day (QID) | ORAL | 0 refills | Status: DC | PRN

## 2017-10-13 MED ORDER — PSEUDOEPHEDRINE-GUAIFENESIN ER 120-1200 MG PO TB12
1.0000 | ORAL_TABLET | Freq: Two times a day (BID) | ORAL | 0 refills | Status: DC
Start: 2017-10-13 — End: 2017-11-19

## 2017-10-13 MED ORDER — IPRATROPIUM BROMIDE 0.03 % NA SOLN: 2.0000 | Freq: Four times a day (QID) | NASAL | 1 refills | Status: DC

## 2017-10-13 NOTE — Patient Instructions (Addendum)
     IF you received an x-ray today, you will receive an invoice from [facility]. Please contact [facility] at [phone] with questions or concerns regarding your invoice.   IF you received labwork today, you will receive an invoice from [facility]. Please contact [facility] at [phone] with questions or concerns regarding your invoice.   Our billing staff will not be able to assist you with questions regarding bills from these companies.  You will be contacted with the lab results as soon as they are available. The fastest way to get your results is to activate your My Chart account. Instructions are located on the last page of this paperwork. If you have not heard from us regarding the results in 2 weeks, please contact this office.     Upper Respiratory Infection, Adult Most upper respiratory infections (URIs) are caused by a virus. A URI affects the nose, throat, and upper air passages. The most common type of URI is often called "the common cold." Follow these instructions at home:  Take medicines only as told by your doctor.  Gargle warm saltwater or take cough drops to comfort your throat as told by your doctor.  Use a warm mist humidifier or inhale steam from a shower to increase air moisture. This may make it easier to breathe.  Drink enough fluid to keep your pee (urine) clear or pale yellow.  Eat soups and other clear broths.  Have a healthy diet.  Rest as needed.  Go back to work when your fever is gone or your doctor says it is okay. ? You may need to stay home longer to avoid giving your URI to others. ? You can also wear a face mask and wash your hands often to prevent spread of the virus.  Use your inhaler more if you have asthma.  Do not use any tobacco products, including cigarettes, chewing tobacco, or electronic cigarettes. If you need help quitting, ask your doctor. Contact a doctor if:  You are getting worse, not better.  Your  symptoms are not helped by medicine.  You have chills.  You are getting more short of breath.  You have brown or red mucus.  You have yellow or brown discharge from your nose.  You have pain in your face, especially when you bend forward.  You have a fever.  You have puffy (swollen) neck glands.  You have pain while swallowing.  You have white areas in the back of your throat. Get help right away if:  You have very bad or constant: ? Headache. ? Ear pain. ? Pain in your forehead, behind your eyes, and over your cheekbones (sinus pain). ? Chest pain.  You have long-lasting (chronic) lung disease and any of the following: ? Wheezing. ? Long-lasting cough. ? Coughing up blood. ? A change in your usual mucus.  You have a stiff neck.  You have changes in your: ? Vision. ? Hearing. ? Thinking. ? Mood. This information is not intended to replace advice given to you by your health care provider. Make sure you discuss any questions you have with your health care provider. Document Released: 04/06/2008 Document Revised: 06/21/2016 Document Reviewed: 01/24/2014 Elsevier Interactive Patient Education  2018 Elsevier Inc.  

## 2017-10-13 NOTE — Progress Notes (Signed)
Subjective:  By signing my name below, I, Nichole Mendez, attest that this documentation has been prepared under the direction and in the presence of Norberto SorensonEva Torrion Witter, MD Electronically Signed: Charline BillsEssence Mendez, ED Scribe 10/13/2017 at 6:03 PM.   Patient ID: Nichole KempsShaina Elizabeth Mendez, female    DOB: [birth year], 21 y.o.   MRN: [id number]  Chief Complaint  Patient presents with  . URI    cough, sore throat, runny nose, brown phlegm x 3 days    HPI Nichole Mendez is a 21 y.o. female who [facility] complaining of sore throat onset 3 days ago. Pt reports intermittent productive cough with brown phlegm, rhinorrhea, fever, chills, generalized myalgias yesterday morning that has resolved. Denies nausea, abdominal pain. Pt has tried Tylenol Cold and Flu to relieve fever and Emergen-C. Pt did not receive her flu vaccine this season.  Past Medical History:  Diagnosis Date  . Ruptured ovarian cyst    Current Outpatient Medications on File Prior to Visit  Medication Sig Dispense Refill  . albuterol (PROVENTIL HFA;VENTOLIN HFA) 108 (90 Base) MCG/ACT inhaler Inhale 2 puffs into the lungs every 6 (six) hours as needed for wheezing or shortness of breath. (Patient not taking: Reported on 12/01/2016) 1 Inhaler 0  . HYDROcodone-homatropine (HYCODAN) 5-1.5 MG/5ML syrup Take 5 mLs by mouth every 8 (eight) hours as needed for cough. (Patient not taking: Reported on 06/16/2017) 120 mL 0  . methocarbamol (ROBAXIN) 500 MG tablet Take one in the morning, one in afternoon and 2 at bedtime as needed for muscle relaxant (Patient not taking: Reported on 12/01/2016) 30 tablet 0  . naproxen (NAPROSYN) 500 MG tablet Take 1 tablet (500 mg total) by mouth 2 (two) times daily. (Patient not taking: Reported on 12/01/2016) 20 tablet 0  . oseltamivir (TAMIFLU) 75 MG capsule Take 1 capsule (75 mg total) by mouth 2 (two) times daily. (Patient not taking: Reported on 06/16/2017) 10 capsule 0  . predniSONE  (DELTASONE) 50 MG tablet Take one daily in the morning. Do not take aleve, ibuprofen, aspirin while taking this. (Patient not taking: Reported on 06/16/2017) 5 tablet 0   No current facility-administered medications on file prior to visit.    Allergies  Allergen Reactions  . Cephalosporins   . Toradol [Ketorolac Tromethamine]    History reviewed. No pertinent surgical history. History reviewed. No pertinent family history. Social History   Socioeconomic History  . Marital status: Single    Spouse name: None  . Number of children: None  . Years of education: None  . Highest education level: None  Social Needs  . Financial resource strain: None  . Food insecurity - worry: None  . Food insecurity - inability: None  . Transportation needs - medical: None  . Transportation needs - non-medical: None  Occupational History  . None  Tobacco Use  . Smoking status: Never Smoker  . Smokeless tobacco: Never Used  Substance and Sexual Activity  . Alcohol use: No  . Drug use: No  . Sexual activity: None  Other Topics Concern  . None  Social History Narrative  . None   [facility] 06/16/2017 12/01/2016 01/21/2016  Decreased Interest 0 0 0  Down, Depressed, Hopeless 0 0 0  PHQ - 2 Score 0 0 0    Review of Systems  Constitutional: Positive for chills and fever.  HENT: Positive for rhinorrhea and sore throat.   Respiratory: Positive for cough.   Gastrointestinal: Negative for abdominal pain  and nausea.  Musculoskeletal: Positive for myalgias (resolved).      Objective:   Physical Exam  Constitutional: She is oriented to person, place, and time. She appears well-developed and well-nourished. No distress.  HENT:  Head: Normocephalic and atraumatic.  Nose: Mucosal edema and rhinorrhea present.  Ears: Canals with cerumen bilaterally  Mouth/Throat: Copious postnasal drip  Eyes: Conjunctivae and EOM are normal.  Neck: Neck supple. No tracheal deviation present.    Cardiovascular: Normal rate, regular rhythm and normal heart sounds.  Pulmonary/Chest: Effort normal and breath sounds normal. No respiratory distress.  Musculoskeletal: Normal range of motion.  Lymphadenopathy:    She has cervical adenopathy (anterior).  Neurological: She is alert and oriented to person, place, and time.  Skin: Skin is warm and dry.  Psychiatric: She has a normal mood and affect. Her behavior is normal.  Nursing note and vitals reviewed.  Pulse 78   Temp 98.5 F (36.9 C)   Resp 16   Ht 5\' 6"  (1.676 m)   Wt 142 lb 3.2 oz (64.5 kg)   SpO2 96%   BMI 22.95 kg/m     Results for orders placed or performed in visit on 10/13/17  POCT rapid strep A  Result Value Ref Range   Rapid Strep A Screen Negative Negative  POC Influenza A&B(BINAX/QUICKVUE)  Result Value Ref Range   Influenza A, POC Negative Negative   Influenza B, POC Negative Negative   Assessment & Plan:   1. Acute pharyngitis, unspecified etiology   2. Upper respiratory tract infection, unspecified type    Call for amox if not improving in sev d.  Orders Placed This Encounter  Procedures  . Culture, Group A Strep    Order Specific Question:   Source    Answer:   oropharynx  . POCT rapid strep A  . POC Influenza A&B(BINAX/QUICKVUE)    Meds ordered this encounter  Medications  . DISCONTD: HYDROcodone-homatropine (HYCODAN) 5-1.5 MG/5ML syrup    Sig: Take 5 mLs by mouth every 6 (six) hours as needed for cough.    Dispense:  100 mL    Refill:  0  . HYDROcodone-homatropine (HYCODAN) 5-1.5 MG/5ML syrup    Sig: Take 5 mLs by mouth every 6 (six) hours as needed for cough.    Dispense:  100 mL    Refill:  0  . ipratropium (ATROVENT) 0.03 % nasal spray    Sig: Place 2 sprays into the nose 4 (four) times daily.    Dispense:  30 mL    Refill:  1  . Pseudoephedrine-Guaifenesin (MUCINEX D MAX STRENGTH) [phone] MG TB12    Sig: Take 1 tablet by mouth 2 (two) times daily.    Dispense:  20 each     Refill:  0    I personally performed the services described in this documentation, which was scribed in my presence. The recorded information has been reviewed and considered, and addended by me as needed.   Norberto SorensonEva Starlett Pehrson, M.D.  [facility]  [place] [address] [place], [place] [address] [phone] phone [phone] fax  10/15/17 7:44 PM

## 2017-10-15 LAB — CULTURE, GROUP A STREP

## 2017-10-29 ENCOUNTER — Ambulatory Visit: Payer: BLUE CROSS/BLUE SHIELD | Admitting: Physician Assistant

## 2017-10-29 VITALS — BP 110/62 | HR 77 | Temp 98.2°F | Resp 16 | Ht 66.0 in | Wt 144.0 lb

## 2017-10-29 DIAGNOSIS — N898 Other specified noninflammatory disorders of vagina: Secondary | ICD-10-CM | POA: Diagnosis not present

## 2017-10-29 DIAGNOSIS — Z124 Encounter for screening for malignant neoplasm of cervix: Secondary | ICD-10-CM | POA: Diagnosis not present

## 2017-10-29 DIAGNOSIS — B379 Candidiasis, unspecified: Secondary | ICD-10-CM

## 2017-10-29 LAB — POCT WET + KOH PREP: Trich by wet prep: ABSENT

## 2017-10-29 LAB — POCT URINE PREGNANCY: PREG TEST UR: NEGATIVE

## 2017-10-29 MED ORDER — FLUCONAZOLE 150 MG PO TABS: 150.0000 mg | ORAL_TABLET | Freq: Once | ORAL | 0 refills | Status: AC

## 2017-10-29 NOTE — Patient Instructions (Addendum)
  Vaginal Yeast infection, Adult Vaginal yeast infection is a condition that causes soreness, swelling, and redness (inflammation) of the vagina. It also causes vaginal discharge. This is a common condition. Some women get this infection frequently. What are the causes? This condition is caused by a change in the normal balance of the yeast (candida) and bacteria that live in the vagina. This change causes an overgrowth of yeast, which causes the inflammation. What increases the risk? This condition is more likely to develop in:  Women who take antibiotic medicines.  Women who have diabetes.  Women who take birth control pills.  Women who are pregnant.  Women who douche often.  Women who have a weak defense (immune) system.  Women who have been taking steroid medicines for a long time.  Women who frequently wear tight clothing.  What are the signs or symptoms? Symptoms of this condition include:  White, thick vaginal discharge.  Swelling, itching, redness, and irritation of the vagina. The lips of the vagina (vulva) may be affected as well.  Pain or a burning feeling while urinating.  Pain during sex.  How is this diagnosed? This condition is diagnosed with a medical history and physical exam. This will include a pelvic exam. Your health care provider will examine a sample of your vaginal discharge under a microscope. Your health care provider may send this sample for testing to confirm the diagnosis. How is this treated? This condition is treated with medicine. Medicines may be over-the-counter or prescription. You may be told to use one or more of the following:  Medicine that is taken orally.  Medicine that is applied as a cream.  Medicine that is inserted directly into the vagina (suppository).  Follow these instructions at home:  Take or apply over-the-counter and prescription medicines only as told by your health care provider.  Do not have sex until your  health care provider has approved. Tell your sex partner that you have a yeast infection. That person should go to his or her health care provider if he or she develops symptoms.  Do not wear tight clothes, such as pantyhose or tight pants.  Avoid using tampons until your health care provider approves.  Eat more yogurt. This may help to keep your yeast infection from returning.  Try taking a sitz bath to help with discomfort. This is a warm water bath that is taken while you are sitting down. The water should only come up to your hips and should cover your buttocks. Do this 3-4 times per day or as told by your health care provider.  Do not douche.  Wear breathable, cotton underwear.  If you have diabetes, keep your blood sugar levels under control. Contact a health care provider if:  You have a fever.  Your symptoms go away and then return.  Your symptoms do not get better with treatment.  Your symptoms get worse.  You have new symptoms.  You develop blisters in or around your vagina.  You have blood coming from your vagina and it is not your menstrual period.  You develop pain in your abdomen. This information is not intended to replace advice given to you by your health care provider. Make sure you discuss any questions you have with your health care provider. Document Released: 07/29/2005 Document Revised: 04/01/2016 Document Reviewed: 04/22/2015 Elsevier Interactive Patient Education  2018 Elsevier Inc.   IF you received an x-ray today, you will receive an invoice from [facility]. Please contact [facility] at [phone] with questions or concerns regarding your invoice.   IF you received labwork today, you will receive an invoice from [facility]. Please contact [facility] at [phone] with questions or concerns regarding your invoice.   Our billing staff will not be able to assist you with questions regarding bills from these companies.  You will  be contacted with the lab results as soon as they are available. The fastest way to get your results is to activate your My Chart account. Instructions are located on the last page of this paperwork. If you have not heard from us regarding the results in 2 weeks, please contact this office.     

## 2017-10-29 NOTE — Progress Notes (Signed)
[facility] [address], [place] [address] [phone]  Date:  10/29/2017   Name:  Nichole Mendez   DOB:  1996/07/24   MRN:  [id number]  PCP:  Ofilia Neaslark, Michael L, PA-C    History of Present Illness:  Nichole Mendez is a 21 y.o. female patient who presents to PCP with  Chief Complaint  Patient presents with  . Gynecologic Exam    pap     2 days ago, she developed more discharge.  Non-odorous.  No abdominal pain, nausea, fever.  No dysuria, hematuria, or abnormal frequency.  She was drinking cranberry juice and water for this, which did not help.  Sexually active with protection.     There are no active problems to display for this patient.   Past Medical History:  Diagnosis Date  . Ruptured ovarian cyst     No past surgical history on file.  Social History   Tobacco Use  . Smoking status: Never Smoker  . Smokeless tobacco: Never Used  Substance Use Topics  . Alcohol use: No  . Drug use: No    No family history on file.  Allergies  Allergen Reactions  . Cephalosporins   . Toradol [Ketorolac Tromethamine]     Medication list has been reviewed and updated.  Current Outpatient Medications on File Prior to Visit  Medication Sig Dispense Refill  . Etonogestrel (NEXPLANON [place]) Inject into the skin.    Marland Kitchen. HYDROcodone-homatropine (HYCODAN) 5-1.5 MG/5ML syrup Take 5 mLs by mouth every 6 (six) hours as needed for cough. (Patient not taking: Reported on 10/29/2017) 100 mL 0  . ipratropium (ATROVENT) 0.03 % nasal spray Place 2 sprays into the nose 4 (four) times daily. (Patient not taking: Reported on 10/29/2017) 30 mL 1  . Pseudoephedrine-Guaifenesin (MUCINEX D MAX STRENGTH) [phone] MG TB12 Take 1 tablet by mouth 2 (two) times daily. (Patient not taking: Reported on 10/29/2017) 20 each 0   No current facility-administered medications on file prior to visit.     ROS ROS otherwise unremarkable unless listed above.  Physical  Examination: BP 110/62   Pulse 77   Temp 98.2 F (36.8 C) (Oral)   Resp 16   Ht 5\' 6"  (1.676 m)   Wt 144 lb (65.3 kg)   SpO2 100%   BMI 23.24 kg/m  Ideal Body Weight: Weight in (lb) to have BMI = 25: 154.6  Physical Exam  Constitutional: She is oriented to person, place, and time. She appears well-developed and well-nourished. No distress.  HENT:  Head: Normocephalic and atraumatic.  Right Ear: External ear normal.  Left Ear: External ear normal.  Eyes: Conjunctivae and EOM are normal. Pupils are equal, round, and reactive to light.  Cardiovascular: Normal rate.  Pulmonary/Chest: Effort normal. No respiratory distress.  Genitourinary: Uterus normal. Pelvic exam was performed with patient supine. Cervix exhibits no motion tenderness, no discharge and no friability. There is bleeding (spotting) in the vagina. No tenderness in the vagina. Vaginal discharge found.  Neurological: She is alert and oriented to person, place, and time.  Skin: She is not diaphoretic.  Psychiatric: She has a normal mood and affect. Her behavior is normal.     Assessment and Plan: Nichole Mendez is a 21 y.o. female who is here today for cc of  Chief Complaint  Patient presents with  . Gynecologic Exam    pap   Yeast infection - Plan: fluconazole (DIFLUCAN) 150 MG tablet  Vaginal discharge - Plan: POCT  Wet + KOH Prep, POCT urine pregnancy  Screening for cervical cancer - Plan: Pap IG and Chlamydia/Gonococcus, NAA  Trena PlattStephanie English, PA-C [facility] 12/28/20189:09 AM

## 2017-11-04 LAB — PAP IG AND CT-NG NAA
CHLAMYDIA, NUC. ACID AMP: NEGATIVE
GONOCOCCUS BY NUCLEIC ACID AMP: NEGATIVE
PAP SMEAR COMMENT: 0

## 2017-11-19 ENCOUNTER — Ambulatory Visit: Payer: BLUE CROSS/BLUE SHIELD | Admitting: Physician Assistant

## 2017-11-19 DIAGNOSIS — H6123 Impacted cerumen, bilateral: Secondary | ICD-10-CM

## 2017-11-19 NOTE — Progress Notes (Signed)
    11/19/2017 2:00 PM   DOB: 05-01-1996 / MRN: [id number]  SUBJECTIVE:  Nichole Mendez is a 22 y.o. female presenting for bilateral cerumen impaction.  States since that she has to have her ears cleaned out about once every year.  She denies any pain today.  Associates a mild decrease in hearing left worse than right.  She is allergic to cephalosporins and toradol [ketorolac tromethamine].   She  has a past medical history of Ruptured ovarian cyst.    She  reports that  has never smoked. she has never used smokeless tobacco. She reports that she does not drink alcohol or use drugs. She  has no sexual activity history on file. The patient  has no past surgical history on file.  Her family history is not on file.  Review of Systems  Constitutional: Negative for fever.  HENT: Negative for congestion, ear discharge, ear pain and tinnitus.     The problem list and medications were reviewed and updated by myself where necessary and exist elsewhere in the encounter.   OBJECTIVE:  There were no vitals taken for this visit.  Physical Exam  Constitutional: She is active.  HENT:  Right Ear: Hearing, tympanic membrane, external ear and ear canal normal.  Left Ear: Hearing, tympanic membrane, external ear and ear canal normal.  Nose: Nose normal. Right sinus exhibits no maxillary sinus tenderness and no frontal sinus tenderness. Left sinus exhibits no maxillary sinus tenderness and no frontal sinus tenderness.  Mouth/Throat: Uvula is midline, oropharynx is clear and moist and mucous membranes are normal. Mucous membranes are not dry. No oropharyngeal exudate, posterior oropharyngeal edema or tonsillar abscesses.  Bilateral cerumen impaction pre-lavage.  Post lavage tympanic membranes pearly gray with good cone of light bilaterally.  Cardiovascular: Normal rate.  Pulmonary/Chest: Effort normal. No tachypnea.  Lymphadenopathy:       Head (right side): No submandibular and no tonsillar  adenopathy present.       Head (left side): No submandibular and no tonsillar adenopathy present.    She has no cervical adenopathy.  Neurological: She is alert.  Skin: Skin is warm.    No results found for this or any previous visit (from the past 72 hour(s)).  No results found.  ASSESSMENT AND PLAN:  Diagnoses and all orders for this visit:  Bilateral hearing loss due to cerumen impaction pleasant young lady here today: Complaint of hearing loss.  Both ear canals impacted with cerumen.  This was lavaged with success.  I have given her the syringe and a low pressure flow tip that she can use once or twice a month to try and prevent this from happening again.  She will come back as needed.    The patient is advised to call or return to clinic if she does not see an improvement in symptoms, or to seek the care of the closest emergency department if she worsens with the above plan.   Deliah BostonMichael Abdiel Blackerby, MHS, PA-C [facility] Care at [facility] 11/19/2017 2:00 PM

## 2017-12-06 ENCOUNTER — Encounter: Payer: Self-pay | Admitting: Family Medicine

## 2017-12-06 ENCOUNTER — Other Ambulatory Visit: Payer: Self-pay

## 2017-12-06 ENCOUNTER — Ambulatory Visit: Payer: BLUE CROSS/BLUE SHIELD | Admitting: Family Medicine

## 2017-12-06 VITALS — BP 100/60 | HR 81 | Temp 98.6°F | Resp 16 | Ht 66.0 in | Wt 146.6 lb

## 2017-12-06 DIAGNOSIS — R21 Rash and other nonspecific skin eruption: Secondary | ICD-10-CM | POA: Diagnosis not present

## 2017-12-06 MED ORDER — CLOTRIMAZOLE 1 % EX CREA: 1.0000 "application " | TOPICAL_CREAM | Freq: Two times a day (BID) | CUTANEOUS | 0 refills | Status: AC

## 2017-12-06 NOTE — Progress Notes (Signed)
   2/4/20195:06 PM  Nichole Mendez [birth year], 22 y.o. female [id number]  Chief Complaint  Patient presents with  . bump    on right inner forearm, "dry and flaky", per pt just noticed it today    HPI:   Patient is a 22 y.o. female who presents today for an itchy dry area on her right inner forearm. She noticed it this morning. Her mother is very concerned it is ring worm as she does not want it to spread in the household. She denies any other areas of involvement.  Depression screen [facility] 2/9 12/06/2017 06/16/2017 12/01/2016  Decreased Interest 0 0 0  Down, Depressed, Hopeless 0 0 0  PHQ - 2 Score 0 0 0    Allergies  Allergen Reactions  . Cephalosporins   . Toradol [Ketorolac Tromethamine]     Prior to Admission medications   Medication Sig Start Date End Date Taking? Authorizing Provider  Etonogestrel (NEXPLANON [place]) Inject into the skin.   Yes [provider]    Past Medical History:  Diagnosis Date  . Ruptured ovarian cyst     No past surgical history on file.  Social History   Tobacco Use  . Smoking status: Never Smoker  . Smokeless tobacco: Never Used  Substance Use Topics  . Alcohol use: No    No family history on file.  ROS Per hpi  OBJECTIVE:  Blood pressure 100/60, pulse 81, temperature 98.6 F (37 C), temperature source Oral, resp. rate 16, height 5\' 6"  (1.676 m), weight 146 lb 9.6 oz (66.5 kg), last menstrual period 12/01/2017, SpO2 98 %.  Physical Exam  Constitutional: She is well-developed, well-nourished, and in no distress.  Skin: Skin is warm and dry.  A 1 mm scaly macule on right inner forearm     ASSESSMENT and PLAN  1. Rash and nonspecific skin eruption Discussed with patient that so small difficult to say what is the cause of the rash. However her mother is very concerned about ring worm and treatment is benign so prescription for clotrimazole given. RTC precautions given.  Other orders - clotrimazole (LOTRIMIN) 1 %  cream; Apply 1 application topically 2 (two) times daily.  Return if symptoms worsen or fail to improve.    Myles LippsIrma M Santiago, MD [facility] [address] [place], [place] [address] Ph.  [phone] Fax [phone]

## 2017-12-06 NOTE — Patient Instructions (Signed)
     IF you received an x-ray today, you will receive an invoice from [facility]. Please contact [facility] at [phone] with questions or concerns regarding your invoice.   IF you received labwork today, you will receive an invoice from [facility]. Please contact [facility] at [phone] with questions or concerns regarding your invoice.   Our billing staff will not be able to assist you with questions regarding bills from these companies.  You will be contacted with the lab results as soon as they are available. The fastest way to get your results is to activate your My Chart account. Instructions are located on the last page of this paperwork. If you have not heard from us regarding the results in 2 weeks, please contact this office.     

## 2017-12-13 ENCOUNTER — Telehealth: Payer: Self-pay | Admitting: Physician Assistant

## 2017-12-13 NOTE — Telephone Encounter (Signed)
Copied from CRM [id number]. Topic: Quick Communication - Rx Refill/Question >> Dec 13, 2017  5:58 PM Alexander BergeronBarksdale, Harvey B wrote: Medication: birth control, pt is requesting birth control, pt would like to speak w/ Dr. Lenox PondsEnglish about this since this subject was spoken about on her last visit w/ her

## 2017-12-14 NOTE — Telephone Encounter (Signed)
Do not see any mention of this in her visit on 12/28

## 2017-12-16 ENCOUNTER — Ambulatory Visit: Payer: BLUE CROSS/BLUE SHIELD | Admitting: Physician Assistant

## 2017-12-17 ENCOUNTER — Ambulatory Visit: Payer: BLUE CROSS/BLUE SHIELD | Admitting: Physician Assistant

## 2017-12-17 ENCOUNTER — Ambulatory Visit: Payer: BLUE CROSS/BLUE SHIELD | Admitting: Family Medicine

## 2017-12-27 ENCOUNTER — Encounter: Payer: Self-pay | Admitting: Physician Assistant

## 2017-12-27 ENCOUNTER — Ambulatory Visit: Payer: BLUE CROSS/BLUE SHIELD | Admitting: Physician Assistant

## 2017-12-27 VITALS — BP 112/66 | HR 97 | Temp 98.0°F | Resp 16 | Ht 66.0 in | Wt 147.0 lb

## 2017-12-27 DIAGNOSIS — N939 Abnormal uterine and vaginal bleeding, unspecified: Secondary | ICD-10-CM

## 2017-12-27 DIAGNOSIS — R590 Localized enlarged lymph nodes: Secondary | ICD-10-CM

## 2017-12-27 LAB — POCT WET + KOH PREP
Trich by wet prep: ABSENT
YEAST BY KOH: ABSENT
YEAST BY WET PREP: ABSENT

## 2017-12-27 LAB — POCT URINE PREGNANCY: Preg Test, Ur: NEGATIVE

## 2017-12-27 MED ORDER — DOXYCYCLINE HYCLATE 100 MG PO CAPS: 100.0000 mg | ORAL_CAPSULE | Freq: Two times a day (BID) | ORAL | 0 refills | Status: AC

## 2017-12-27 NOTE — Progress Notes (Signed)
PRIMARY CARE AT [facility] [address] [place] [address] [phone]  Date:  12/27/2017   Name:  Nichole Mendez   DOB:  1996-09-14   MRN:  [id number]  PCP:  Nichole Neas, PA-C    History of Present Illness:  Nichole Mendez is a 22 y.o. female patient who presents to PCP with  Chief Complaint  Patient presents with  . Contraception    pt would like pills to prevent break through bleedinfg from her nexplanon  . Adenopathy    x 2months     Patient reports intermittent break through bleeding for the last 2 weeks.  She is currently with nexplanon.  Placed 2 years ago by gyn in [place]. No lower abdominal pain at this time.  No dysuria, hematuria, or frequency.   When the bleeding is not apparent, there is no odd vaginal discharge or odor.   She notes that she has had estrogen in the past by her gynecologist for 1 month.  Last sexually active 2 months ago  She also complains of lymph node swelling for the last 2 months.  No fatigue, sob, night sweats, or coughing.  She has not had any fever.  She recalls being sick just prior to the prominent nodes, and they never went away.  There are no active problems to display for this patient.   Past Medical History:  Diagnosis Date  . Ruptured ovarian cyst     History reviewed. No pertinent surgical history.  Social History   Tobacco Use  . Smoking status: Never Smoker  . Smokeless tobacco: Never Used  Substance Use Topics  . Alcohol use: No  . Drug use: No    History reviewed. No pertinent family history.  Allergies  Allergen Reactions  . Cephalosporins   . Toradol [Ketorolac Tromethamine]     Medication list has been reviewed and updated.  Current Outpatient Medications on File Prior to Visit  Medication Sig Dispense Refill  . clotrimazole (LOTRIMIN) 1 % cream Apply 1 application topically 2 (two) times daily. 30 g 0  . Etonogestrel (NEXPLANON [place]) Inject into the skin.     No current  facility-administered medications on file prior to visit.     ROS ROS otherwise unremarkable unless listed above.  Physical Examination: BP 112/66   Pulse 97   Temp 98 F (36.7 C) (Oral)   Resp 16   Ht 5\' 6"  (1.676 m)   Wt 147 lb (66.7 kg)   LMP 12/01/2017   SpO2 98%   BMI 23.73 kg/m  Ideal Body Weight: Weight in (lb) to have BMI = 25: 154.6  Physical Exam  Constitutional: She is oriented to person, place, and time. She appears well-developed and well-nourished. No distress.  HENT:  Head: Normocephalic and atraumatic.  Right Ear: External ear normal.  Left Ear: External ear normal.  Mouth/Throat: No oropharyngeal exudate, posterior oropharyngeal edema or posterior oropharyngeal erythema.  Eyes: Conjunctivae and EOM are normal. Pupils are equal, round, and reactive to light.  Cardiovascular: Normal rate.  Pulmonary/Chest: Effort normal. No respiratory distress.  Genitourinary: Pelvic exam was performed with patient supine. There is no rash on the right labia. There is no rash on the left labia. Cervix exhibits no motion tenderness, no discharge and no friability. Right adnexum displays no tenderness. Left adnexum displays no tenderness. There is bleeding (dry sanguinous blood.) in the vagina.  Lymphadenopathy:       Head (right side): No submental, no submandibular and no tonsillar  adenopathy present.       Head (left side): No submental, no submandibular and no tonsillar adenopathy present.    She has cervical adenopathy.       Right cervical: Superficial cervical adenopathy present.       Right: No supraclavicular adenopathy present.       Left: No supraclavicular adenopathy present.  Non-tender I can palpate similar lymph node bilaterally  Neurological: She is alert and oriented to person, place, and time.  Skin: She is not diaphoretic.  Psychiatric: She has a normal mood and affect. Her behavior is normal.     Results for orders placed or performed in visit on  12/27/17  POCT Wet + KOH Prep  Result Value Ref Range   Yeast by KOH Absent Absent   Yeast by wet prep Absent Absent   WBC by wet prep None (A) Few   Clue Cells Wet Prep HPF POC None None   Trich by wet prep Absent Absent   Bacteria Wet Prep HPF POC Moderate (A) Few   Epithelial Cells By Principal FinancialWet Pref (UMFC) Few None, Few, Too numerous to count   RBC,UR,HPF,POC Moderate (A) None RBC/hpf  POCT urine pregnancy  Result Value Ref Range   Preg Test, Ur Negative Negative     Assessment and Plan: Nichole Mendez is a 22 y.o. female who is here today for cc of  Chief Complaint  Patient presents with  . Contraception    pt would like pills to prevent break through bleedinfg from her nexplanon  . Adenopathy    x 2months  --will obtain labs at this time due to longevity of the lymph nodes.  This may be her normal anatomy.  May consider ultrasound pending blood work. --given doxycycline for this unscheduled bleeding with implant.  advised to follow up 1 month if it returns.  She is to contact my via mychart, if she has no improvement . Vaginal bleeding - Plan: POCT Wet + KOH Prep, POCT urine pregnancy, GC/Chlamydia Probe Amp  Lymphadenopathy of right cervical region - Plan: CBC with Differential/Platelet, TSH, HIV antibody, Sedimentation Rate  Nichole PlattStephanie Sendy Pluta, PA-C [facility] 2/25/20192:50 PM

## 2017-12-27 NOTE — Patient Instructions (Addendum)
I am giving you doxycycline.  Please take as prescribed.  This should resolve within 2 weeks.  If this is not working within that time, let me know. If it resolves, great.  If it returns in 1 month, you will need to come in.   Dysfunctional Uterine Bleeding Dysfunctional uterine bleeding is abnormal bleeding from the uterus. Dysfunctional uterine bleeding includes:  A period that comes earlier or later than usual.  A period that is lighter, heavier, or has blood clots.  Bleeding between periods.  Skipping one or more periods.  Bleeding after sexual intercourse.  Bleeding after menopause.  Follow these instructions at home: Pay attention to any changes in your symptoms. Follow these instructions to help with your condition: Eating and drinking  Eat well-balanced meals. Include foods that are high in iron, such as liver, meat, shellfish, green leafy vegetables, and eggs.  If you become constipated: ? Drink plenty of water. ? Eat fruits and vegetables that are high in water and fiber, such as spinach, carrots, raspberries, apples, and mango. Medicines  Take over-the-counter and prescription medicines only as told by your health care provider.  Do not change medicines without talking with your health care provider.  Aspirin or medicines that contain aspirin may make the bleeding worse. Do not take those medicines: ? During the week before your period. ? During your period.  If you were prescribed iron pills, take them as told by your health care provider. Iron pills help to replace iron that your body loses because of this condition. Activity  If you need to change your sanitary pad or tampon more than one time every 2 hours: ? Lie in bed with your feet raised (elevated). ? Place a cold pack on your lower abdomen. ? Rest as much as possible until the bleeding stops or slows down.  Do not try to lose weight until the bleeding has stopped and your blood iron level is back to  normal. Other Instructions  For two months, write down: ? When your period starts. ? When your period ends. ? When any abnormal bleeding occurs. ? What problems you notice.  Keep all follow up visits as told by your health care provider. This is important. Contact a health care provider if:  You get light-headed or weak.  You have nausea and vomiting.  You cannot eat or drink without vomiting.  You feel dizzy or have diarrhea while you are taking medicines.  You are taking birth control pills or hormones, and you want to change them or stop taking them. Get help right away if:  You develop a fever or chills.  You need to change your sanitary pad or tampon more than one time per hour.  Your bleeding becomes heavier, or your flow contains clots more often.  You develop pain in your abdomen.  You lose consciousness.  You develop a rash. This information is not intended to replace advice given to you by your health care provider. Make sure you discuss any questions you have with your health care provider. Document Released: 10/16/2000 Document Revised: 03/26/2016 Document Reviewed: 01/14/2015 Elsevier Interactive Patient Education  2018 [employer]     IF you received an x-ray today, you will receive an invoice from [facility]. Please contact [facility] at [phone] with questions or concerns regarding your invoice.   IF you received labwork today, you will receive an invoice from [facility]. Please contact [facility] at [phone] with questions or concerns regarding your invoice.  Our billing staff will not be able to assist you with questions regarding bills from these companies.  You will be contacted with the lab results as soon as they are available. The fastest way to get your results is to activate your My Chart account. Instructions are located on the last page of this paperwork. If you have not heard from [place] regarding the results in 2  weeks, please contact this office.

## 2017-12-28 LAB — CBC WITH DIFFERENTIAL/PLATELET
BASOS: 1 %
Basophils Absolute: 0.1 10*3/uL (ref 0.0–0.2)
EOS (ABSOLUTE): 0.2 10*3/uL (ref 0.0–0.4)
EOS: 3 %
HEMATOCRIT: 39.8 % (ref 34.0–46.6)
Hemoglobin: 13.2 g/dL (ref 11.1–15.9)
Immature Grans (Abs): 0 10*3/uL (ref 0.0–0.1)
Immature Granulocytes: 0 %
LYMPHS ABS: 2.4 10*3/uL (ref 0.7–3.1)
Lymphs: 42 %
MCH: 31.3 pg (ref 26.6–33.0)
MCHC: 33.2 g/dL (ref 31.5–35.7)
MCV: 94 fL (ref 79–97)
MONOS ABS: 0.3 10*3/uL (ref 0.1–0.9)
Monocytes: 6 %
NEUTROS ABS: 2.8 10*3/uL (ref 1.4–7.0)
Neutrophils: 48 %
Platelets: 310 10*3/uL (ref 150–379)
RBC: 4.22 x10E6/uL (ref 3.77–5.28)
RDW: 13.4 % (ref 12.3–15.4)
WBC: 5.7 10*3/uL (ref 3.4–10.8)

## 2017-12-28 LAB — GC/CHLAMYDIA PROBE AMP
Chlamydia trachomatis, NAA: NEGATIVE
Neisseria gonorrhoeae by PCR: NEGATIVE

## 2017-12-28 LAB — HIV ANTIBODY (ROUTINE TESTING W REFLEX): HIV Screen 4th Generation wRfx: NONREACTIVE

## 2017-12-28 LAB — SEDIMENTATION RATE: Sed Rate: 10 mm/hr (ref 0–32)

## 2017-12-28 LAB — TSH: TSH: 1.25 u[IU]/mL (ref 0.450–4.500)

## 2017-12-29 ENCOUNTER — Telehealth: Payer: Self-pay | Admitting: Physician Assistant

## 2017-12-29 NOTE — Telephone Encounter (Unsigned)
Copied from CRM [id number]. Topic: Quick Communication - See Telephone Encounter >> Dec 29, 2017  9:36 AM Raquel SarnaHayes, Teresa G wrote: Labs - Pt needing to discuss and has questions.    Please call pt back asap.

## 2018-01-02 NOTE — Telephone Encounter (Signed)
Pt phone message sent to Deliah BostonMichael Clark re: needs to discuss labs

## 2018-01-02 NOTE — Telephone Encounter (Signed)
I called her back and she was at her parents house.  I advised that she call back tomorrow and I will try to get in touch with her during my clinic. Deliah BostonMichael Clark, MS, PA-C 9:23 PM, 01/02/2018

## 2018-01-04 NOTE — Telephone Encounter (Signed)
Provider, were you able to reach this patient? If not, please release labs for clinical staff to call regarding results.

## 2018-01-04 NOTE — Telephone Encounter (Signed)
Okay to release. I've reviewed and all are negative.

## 2018-01-05 NOTE — Telephone Encounter (Signed)
Mailbox full unable to leave message.

## 2018-01-05 NOTE — Telephone Encounter (Signed)
Phone call to patient. Poor phone connection - not sure if patient was able to understand this RN. Call ended and then returned - unable to leave message, mailbox is full.  If patient calls back, please notify her of results.

## 2018-01-11 ENCOUNTER — Telehealth: Payer: Self-pay | Admitting: Physician Assistant

## 2018-01-11 NOTE — Telephone Encounter (Signed)
Phone call to patient. Patient advised provider had instructed her to return if she is still having symptoms. Patient verbalizes understanding. Transferred to front desk to schedule.

## 2018-01-11 NOTE — Telephone Encounter (Signed)
Copied from CRM [phone]. Topic: Quick Communication - See Telephone Encounter >> Jan 11, 2018  4:55 PM Terisa Starraylor, Brittany L wrote: CRM for notification. See Telephone encounter for:   01/11/18. Pt said that she was prescribed an antibiotic around the end of february & she told her to call back if it didn't help. Patient is still having symptoms of break through bleeding. [id number] - [place], [place] - [address]   Please route to Dr Lenox PondsEnglish

## 2018-02-01 ENCOUNTER — Encounter: Payer: Self-pay | Admitting: Physician Assistant

## 2018-02-02 ENCOUNTER — Ambulatory Visit: Payer: BLUE CROSS/BLUE SHIELD | Admitting: Gynecology

## 2019-07-24 ENCOUNTER — Encounter: Payer: Self-pay | Admitting: Gynecology
